# Patient Record
Sex: Female | Born: 1980 | Race: White | Hispanic: No | Marital: Single | Smoking: Current every day smoker
Health system: Southern US, Community
[De-identification: ages and names within clinical notes are randomized; demographics above are authoritative.]

## PROBLEM LIST (undated history)

## (undated) DIAGNOSIS — M419 Scoliosis, unspecified: Secondary | ICD-10-CM

## (undated) DIAGNOSIS — F419 Anxiety disorder, unspecified: Secondary | ICD-10-CM

---

## 2008-10-23 ENCOUNTER — Ambulatory Visit: Payer: Self-pay | Admitting: Family Medicine

## 2010-10-14 ENCOUNTER — Emergency Department: Payer: Self-pay | Admitting: Emergency Medicine

## 2011-01-22 ENCOUNTER — Emergency Department: Payer: Self-pay | Admitting: Emergency Medicine

## 2011-12-22 DIAGNOSIS — O14 Pre-eclampsia: Secondary | ICD-10-CM

## 2012-02-16 ENCOUNTER — Ambulatory Visit: Payer: Self-pay | Admitting: Internal Medicine

## 2012-05-28 ENCOUNTER — Emergency Department: Payer: Self-pay | Admitting: Emergency Medicine

## 2012-05-28 LAB — COMPREHENSIVE METABOLIC PANEL
Albumin: 4.2 g/dL (ref 3.4–5.0)
Alkaline Phosphatase: 61 U/L (ref 50–136)
Anion Gap: 7 (ref 7–16)
BUN: 12 mg/dL (ref 7–18)
Calcium, Total: 8.8 mg/dL (ref 8.5–10.1)
Co2: 27 mmol/L (ref 21–32)
Creatinine: 0.83 mg/dL (ref 0.60–1.30)
EGFR (Non-African Amer.): >60
Potassium: 3.9 mmol/L (ref 3.5–5.1)
SGOT(AST): 27 U/L (ref 15–37)
SGPT (ALT): 26 U/L (ref 12–78)
Total Protein: 8 g/dL (ref 6.4–8.2)

## 2012-05-28 LAB — CK TOTAL AND CKMB (NOT AT ARMC)
CK, Total: 101 U/L (ref 21–215)
CK-MB: <0.5 ng/mL — ABNORMAL LOW (ref 0.5–3.6)

## 2012-05-28 LAB — TROPONIN I: Troponin-I: <0.02 ng/mL

## 2012-05-28 LAB — PREGNANCY, URINE: Pregnancy Test, Urine: NEGATIVE m[IU]/mL

## 2012-05-28 LAB — CBC
MCH: 31.1 pg (ref 26.0–34.0)
MCHC: 34.7 g/dL (ref 32.0–36.0)
Platelet: 330 10*3/uL (ref 150–440)
RDW: 13 % (ref 11.5–14.5)

## 2012-05-28 LAB — URINALYSIS, COMPLETE
Bilirubin,UR: NEGATIVE
Ph: 7 (ref 4.5–8.0)
Protein: NEGATIVE
RBC,UR: 1 /HPF (ref 0–5)
Squamous Epithelial: 11
WBC UR: NONE SEEN /HPF (ref 0–5)

## 2012-09-07 ENCOUNTER — Emergency Department: Payer: Self-pay | Admitting: Unknown Physician Specialty

## 2013-07-06 ENCOUNTER — Inpatient Hospital Stay: Payer: Self-pay

## 2013-07-06 LAB — CBC WITH DIFFERENTIAL/PLATELET
Eosinophil #: 0.1 10*3/uL (ref 0.0–0.7)
Eosinophil %: 0.6 %
HCT: 34.8 % — ABNORMAL LOW (ref 35.0–47.0)
Lymphocyte %: 14.5 %
MCH: 30.3 pg (ref 26.0–34.0)
MCHC: 35.3 g/dL (ref 32.0–36.0)
MCV: 86 fL (ref 80–100)
Monocyte %: 7.6 %
Neutrophil #: 10.8 10*3/uL — ABNORMAL HIGH (ref 1.4–6.5)
Platelet: 337 10*3/uL (ref 150–440)
RBC: 4.05 10*6/uL (ref 3.80–5.20)
RDW: 13.6 % (ref 11.5–14.5)
WBC: 14.1 10*3/uL — ABNORMAL HIGH (ref 3.6–11.0)

## 2014-05-10 ENCOUNTER — Emergency Department: Payer: Self-pay | Admitting: Emergency Medicine

## 2014-07-26 ENCOUNTER — Emergency Department: Payer: Self-pay | Admitting: Emergency Medicine

## 2015-02-04 ENCOUNTER — Emergency Department
Admit: 2015-02-04 | Disposition: A | Discharge status: Home / Self Care | Payer: Self-pay | Admitting: Emergency Medicine

## 2015-02-04 LAB — COMPREHENSIVE METABOLIC PANEL
ALK PHOS: 58 U/L
ALT: 16 U/L
ANION GAP: 10 (ref 7–16)
AST: 27 U/L
Albumin: 4.5 g/dL
BILIRUBIN TOTAL: 0.3 mg/dL
BUN: 8 mg/dL
CHLORIDE: 100 mmol/L — AB
Calcium, Total: 9.3 mg/dL
Co2: 26 mmol/L
Creatinine: 0.71 mg/dL
EGFR (African American): >60
Glucose: 113 mg/dL — ABNORMAL HIGH
Potassium: 3.5 mmol/L
Sodium: 136 mmol/L
TOTAL PROTEIN: 7.8 g/dL

## 2015-02-04 LAB — CBC WITH DIFFERENTIAL/PLATELET
Basophil #: 0 10*3/uL (ref 0.0–0.1)
Basophil %: 0.6 %
Eosinophil #: 0 10*3/uL (ref 0.0–0.7)
Eosinophil %: 0.7 %
HCT: 39.5 % (ref 35.0–47.0)
HGB: 13.7 g/dL (ref 12.0–16.0)
LYMPHS PCT: 24 %
Lymphocyte #: 0.9 10*3/uL — ABNORMAL LOW (ref 1.0–3.6)
MCH: 31.2 pg (ref 26.0–34.0)
MCHC: 34.6 g/dL (ref 32.0–36.0)
MCV: 90 fL (ref 80–100)
MONOS PCT: 13 %
Monocyte #: 0.5 x10 3/mm (ref 0.2–0.9)
NEUTROS ABS: 2.2 10*3/uL (ref 1.4–6.5)
Neutrophil %: 61.7 %
Platelet: 309 10*3/uL (ref 150–440)
RBC: 4.38 10*6/uL (ref 3.80–5.20)
RDW: 12.1 % (ref 11.5–14.5)
WBC: 3.6 10*3/uL (ref 3.6–11.0)

## 2015-02-04 LAB — URINALYSIS, COMPLETE
Bilirubin,UR: NEGATIVE
Glucose,UR: NEGATIVE mg/dL (ref 0–75)
KETONE: NEGATIVE
Leukocyte Esterase: NEGATIVE
Nitrite: NEGATIVE
PH: 7 (ref 4.5–8.0)
PROTEIN: NEGATIVE
SPECIFIC GRAVITY: 1.009 (ref 1.003–1.030)

## 2015-02-16 NOTE — H&P (Signed)
L&D Evaluation:  History:  HPI Pt is a 34 yo G4P1021 that is 39.[redacted] weeks GA who presents to L&D with painful ctx that started last night. She has a hx of a previous c-section for failure to descend. [facility] and has [facility] signed to [facility] so. She reports +FM, denies vb or LOF. She is O+, VI, RNI and GBS-. She is 9/100 with a bulging bag upon admission.   Presents with contractions   Patient's Medical History other  anemia   Patient's Surgical History Previous C-Section   Medications Pre Natal Vitamins  Iron   Allergies NKDA   Social History none   Family History Non-Contributory   ROS:  ROS All systems were reviewed.  HEENT, CNS, GI, GU, Respiratory, CV, Renal and Musculoskeletal systems were found to be normal.   Exam:  Vital Signs stable   General no apparent distress   Mental Status clear   Chest clear   Heart normal sinus rhythm   Abdomen gravid, tender with contractions   Back no CVAT   Mebranes Intact   FHT normal rate with no decels   Fetal Heart Rate 130   Ucx regular, every 2-4 min   Skin dry, no lesions   Lymph no lymphadenopathy   Impression:  Impression active labor, reactive NST, IUP at 39.5, desires VBAC   Plan:  Plan admit under VBAC protocol.   Follow Up Appointment need to schedule   Electronic Signatures: Jannet MantisSubudhi, Maikel Neisler (CNM)  (Signed 28-Sep-14 10:33)  Authored: L&D Evaluation   Last Updated: 28-Sep-14 10:33 by Jannet MantisSubudhi, Rashaad Hallstrom (CNM)

## 2015-07-19 ENCOUNTER — Encounter: Payer: Self-pay | Admitting: Emergency Medicine

## 2015-07-19 ENCOUNTER — Emergency Department
Admission: EM | Admit: 2015-07-19 | Discharge: 2015-07-19 | Disposition: A | Discharge status: Home / Self Care | Payer: Medicaid Other | Attending: Emergency Medicine | Admitting: Emergency Medicine

## 2015-07-19 DIAGNOSIS — M545 Low back pain, unspecified: Secondary | ICD-10-CM

## 2015-07-19 DIAGNOSIS — Z3202 Encounter for pregnancy test, result negative: Secondary | ICD-10-CM | POA: Diagnosis not present

## 2015-07-19 DIAGNOSIS — Z720 Tobacco use: Secondary | ICD-10-CM | POA: Insufficient documentation

## 2015-07-19 HISTORY — DX: Scoliosis, unspecified: M41.9

## 2015-07-19 LAB — URINALYSIS COMPLETE WITH MICROSCOPIC (ARMC ONLY)
BILIRUBIN URINE: NEGATIVE
GLUCOSE, UA: NEGATIVE mg/dL
HGB URINE DIPSTICK: NEGATIVE
Ketones, ur: NEGATIVE mg/dL
Leukocytes, UA: NEGATIVE
NITRITE: NEGATIVE
Protein, ur: NEGATIVE mg/dL
SPECIFIC GRAVITY, URINE: 1.01 (ref 1.005–1.030)
pH: 8 (ref 5.0–8.0)

## 2015-07-19 LAB — COMPREHENSIVE METABOLIC PANEL
ALBUMIN: 4.6 g/dL (ref 3.5–5.0)
ALT: 15 U/L (ref 14–54)
AST: 22 U/L (ref 15–41)
Alkaline Phosphatase: 50 U/L (ref 38–126)
Anion gap: 7 (ref 5–15)
BILIRUBIN TOTAL: 0.3 mg/dL (ref 0.3–1.2)
BUN: 15 mg/dL (ref 6–20)
CALCIUM: 9.1 mg/dL (ref 8.9–10.3)
CO2: 27 mmol/L (ref 22–32)
Chloride: 101 mmol/L (ref 101–111)
Creatinine, Ser: 0.61 mg/dL (ref 0.44–1.00)
GFR calc Af Amer: >60 mL/min (ref >60)
GFR calc non Af Amer: >60 mL/min (ref >60)
GLUCOSE: 91 mg/dL (ref 65–99)
Potassium: 3.8 mmol/L (ref 3.5–5.1)
Sodium: 135 mmol/L (ref 135–145)
Total Protein: 7.6 g/dL (ref 6.5–8.1)

## 2015-07-19 LAB — CBC
HCT: 36.1 % (ref 35.0–47.0)
Hemoglobin: 12.4 g/dL (ref 12.0–16.0)
MCH: 31 pg (ref 26.0–34.0)
MCHC: 34.4 g/dL (ref 32.0–36.0)
MCV: 90.2 fL (ref 80.0–100.0)
Platelets: 300 10*3/uL (ref 150–440)
RBC: 4 MIL/uL (ref 3.80–5.20)
RDW: 12.3 % (ref 11.5–14.5)
WBC: 4.7 10*3/uL (ref 3.6–11.0)

## 2015-07-19 LAB — POCT PREGNANCY, URINE: Preg Test, Ur: NEGATIVE

## 2015-07-19 LAB — LIPASE, BLOOD: Lipase: 26 U/L (ref 22–51)

## 2015-07-19 MED ORDER — TRAMADOL HCL 50 MG PO TABS: 50.0000 mg | ORAL_TABLET | Freq: Four times a day (QID) | ORAL | Status: AC | PRN

## 2015-07-19 MED ORDER — TRAMADOL HCL 50 MG PO TABS
ORAL_TABLET | ORAL | Status: AC
  Administered 2015-07-19: 50 mg via ORAL
  Filled 2015-07-19: qty 1

## 2015-07-19 MED ORDER — TRAMADOL HCL 50 MG PO TABS
50.0000 mg | ORAL_TABLET | Freq: Once | ORAL | Status: AC
  Administered 2015-07-19: 50 mg via ORAL

## 2015-07-19 NOTE — ED Notes (Signed)
States has had flank pain x several months, thought it was due to scoliosis but became concerned she might have a kidney infection, denies fevers, urine has unusual odor

## 2015-07-19 NOTE — ED Provider Notes (Signed)
[facility] Emergency Department Provider Note  Time seen: 8:14 PM  I have reviewed the triage vital signs and the nursing notes.   HISTORY  Chief Complaint Flank Pain    HPI Cheyenne Weeks is a 34 y.o. female with a past medical history of scoliosis presents to the emergency department with lower back pain. According to the patient she has had lower back pain intermittently for the past several months, but has been worse over the past several days and the patient was concerned that she might be having a urinary/kidney infection. She states several months ago she had similar pain and ended up having a kidney infection requiring antibiotics. Patient states a history of scoliosis and often will get back pain, so she is not sure if it is more muscular pain or if it is a kidney infection. Denies any dysuria, has stated somewhat darker appearing urine, denies any fever, nausea, vomiting, diarrhea, vaginal discharge or bleeding. Describes her discomfort as moderate.     Past Medical History  Diagnosis Date  . Scoliosis     There are no active problems to display for this patient.   Past Surgical History  Procedure Laterality Date  . Cesarean section      No current outpatient prescriptions on file.  Allergies Review of patient's allergies indicates no known allergies.  No family history on file.  Social History Social History  Substance Use Topics  . Smoking status: Current Every Day Smoker -- 0.10 packs/day    Types: Cigarettes  . Smokeless tobacco: None  . Alcohol Use: Yes    Review of Systems Constitutional: Negative for fever. Cardiovascular: Negative for chest pain. Respiratory: Negative for shortness of breath. Gastrointestinal: Negative for abdominal pain Genitourinary: Negative for dysuria. Darker urine. Musculoskeletal: Moderate lower back pain bilaterally 10-point ROS otherwise  negative.  ____________________________________________   PHYSICAL EXAM:  VITAL SIGNS: ED Triage Vitals  Enc Vitals Group     BP 07/19/15 1840 127/96 mmHg     Pulse Rate 07/19/15 1840 85     Resp 07/19/15 1840 18     Temp 07/19/15 1840 98.7 F (37.1 C)     Temp Source 07/19/15 1840 Oral     SpO2 07/19/15 1840 100 %     Weight 07/19/15 1840 138 lb (62.596 kg)     Height 07/19/15 1840  (1.651 m)     Head Cir --      Peak Flow --      Pain Score 07/19/15 1841 6     Pain Loc --      Pain Edu? --      Excl. in GC? --     Constitutional: Alert and oriented. Well appearing and in no distress. Eyes: Normal exam ENT   Head: Normocephalic and atraumatic.   Mouth/Throat: Mucous membranes are moist. Cardiovascular: Normal rate, regular rhythm. No murmur Respiratory: Normal respiratory effort without tachypnea nor retractions. Breath sounds are clear and equal bilaterally. No wheezes/rales/rhonchi. Gastrointestinal: Soft and nontender. No distention.  There is no CVA tenderness. Mild lower/lumbar spinal tenderness palpation. Musculoskeletal: Nontender with normal range of motion in all extremities.  Neurologic:  Normal speech and language. No gross focal neurologic deficits  Psychiatric: Mood and affect are normal. Speech and behavior are normal. Patient exhibits appropriate insight and judgment.  ____________________________________________    INITIAL IMPRESSION / ASSESSMENT AND PLAN / ED COURSE  Pertinent labs & imaging results that were available during my care of the patient were  reviewed by me and considered in my medical decision making (see chart for details).  Labs are within normal limits. Urinalysis does not show any signs of urinary tract infection. I discussed results with the patient, I will add on a urine culture and prescribed Ultram for likely musculoskeletal back pain. Patient is agreeable to plan and will follow up with her primary care physician if pain  continues.  ____________________________________________   FINAL CLINICAL IMPRESSION(S) / ED DIAGNOSES  Back pain   Minna Antis, MD 07/19/15 2017

## 2015-07-19 NOTE — Discharge Instructions (Signed)

## 2015-07-21 LAB — URINE CULTURE: Culture: 7000

## 2016-01-24 ENCOUNTER — Encounter: Payer: Self-pay | Admitting: Emergency Medicine

## 2016-01-24 ENCOUNTER — Emergency Department
Admission: EM | Admit: 2016-01-24 | Discharge: 2016-01-24 | Disposition: A | Discharge status: Home / Self Care | Payer: Medicaid Other | Attending: Emergency Medicine | Admitting: Emergency Medicine

## 2016-01-24 DIAGNOSIS — F17210 Nicotine dependence, cigarettes, uncomplicated: Secondary | ICD-10-CM | POA: Diagnosis not present

## 2016-01-24 DIAGNOSIS — S0922XA Traumatic rupture of left ear drum, initial encounter: Secondary | ICD-10-CM | POA: Diagnosis not present

## 2016-01-24 DIAGNOSIS — H9202 Otalgia, left ear: Secondary | ICD-10-CM

## 2016-01-24 DIAGNOSIS — Y999 Unspecified external cause status: Secondary | ICD-10-CM | POA: Diagnosis not present

## 2016-01-24 DIAGNOSIS — Y929 Unspecified place or not applicable: Secondary | ICD-10-CM | POA: Insufficient documentation

## 2016-01-24 DIAGNOSIS — Y939 Activity, unspecified: Secondary | ICD-10-CM | POA: Insufficient documentation
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
## 2016-01-24 NOTE — Discharge Instructions (Signed)
Tympanoplasty Your eardrum (tympanic membrane) is a thin layer of tissue that separates the outer part of your ear from your middle ear. Sometimes a hole can develop in your tympanic membrane. It can tear or become perforated. A hole in your tympanic membrane can allow water and germs to enter your middle ear and can cause infection and hearing loss.  A small hole in your tympanic membrane that is not infected often heals by itself. A hole that does not heal on its own may require surgery (tympanoplasty). This surgery repairs the hole with a piece of tissue from another part of your body (graft). Your caregiver usually will wait at least 6 weeks after the hole develops to see if it heals by itself.  The most common causes of a hole developing in the tympanic membrane include:  Infection.  Injury.  Puncture injury. Perforation caused by an object inserted too far into the ear, such as a cotton-tipped swab.  Air pressure injury. Perforation caused by a sudden change in air pressure, such as during scuba diving. LET YOUR CAREGIVER KNOW ABOUT:  History of bleeding disorders.  Recent colds or infections you have had, particularly any drainage or moisture in your injured ear.  Allergies you have to medicine, including numbing medicine (local anesthetics) and medicine to make you go to sleep (general anesthetic).  All medicines you are taking, including herbs, over-the-counter medicines, and creams.  Previous surgeries you have had.  History of smoking.  Possibility of pregnancy, if this applies.  History of blood clots.  Other health problems. RISKS AND COMPLICATIONS Complications associated with tympanoplasty are rare, but they can occur. Possible complications include:  Dizziness or a feeling that things are spinning around you (vertigo).  Bleeding or infection in the ear.  Damage to the small bones in your ear, which can cause hearing loss.  Nerve damage. A small nerve that  transmits taste from the sides of your tongue goes through the middle part of your ear. If this nerve is damaged during surgery, you may have a metal taste in your mouth. It usually goes away in time.  Graft failure. This means that the graft does not attach to the tympanic membrane. BEFORE THE PROCEDURE Your caregiver will:  Examine your ear before surgery to ensure that your tympanic membrane is dry.  Test your hearing(audiometry).  Do an X-ray if there is any signs of infection. You will need to stop eating and drinking 8 hours before the surgery. Also, you may need to stop taking certain medicines before the surgery. Ask your caregiver for specific instructions. Just before your procedure, you will speak with an anesthesiologist. This is the person who will administer the anesthetic. Your anesthesiologist will talk to you about the type of anesthetic that will be used. Usually, tympanoplasty is performed with the use of a general anesthetic.  Small monitors will be placed on your body to check your heart rate, blood pressure, and oxygen level. An intravenous (IV) tube will be inserted in your arm or hand. Medicine will flow directly into your body through the IV tube. PROCEDURE To repair a hole in your tympanic membrane, a graftis used. The graft will act as a patch. Most of the time, the graft comes from your scalp. To put the graft in place, a cut (incision) is made in your ear canal, and the graft is slipped under the hole in the tympanic membrane. Dissolvable sponges are placed in your middle ear and in your ear canal  to hold the graft in place. The surgery usually takes 1 to 2 hours. Antibiotic drops may be used in your ear to prevent infection. A cotton ball is placed over the opening of your ear. AFTER THE PROCEDURE  You will be taken to a recovery area until the anesthesia has worn off. Your blood pressure and pulse will be checked regularly. The IV tube will be taken out.  Some  pain is normal after tympanoplasty. You may be given pain medicine while you are in the recovery area.  Most people can go home a few hours after surgery.   This information is not intended to replace advice given to you by your health care provider. Make sure you discuss any questions you have with your health care provider.   Document Released: 03/03/2011 Document Revised: 01/20/2013 Document Reviewed: 03/03/2011 Elsevier Interactive Patient Education 2016 [employer].   You have a small defect (hole) in your ear drum. It will likely heal without intervention. Avoid getting any water or fluids into your ear. Follow-up with ENT in a week or 2 as directed.
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
## 2016-01-24 NOTE — ED Notes (Signed)
Pt presents with left ear pain since Saturday.

## 2016-03-28 ENCOUNTER — Encounter: Payer: Self-pay | Admitting: Emergency Medicine

## 2016-03-28 ENCOUNTER — Emergency Department
Admission: EM | Admit: 2016-03-28 | Discharge: 2016-03-28 | Disposition: A | Discharge status: Home / Self Care | Payer: Medicaid Other | Attending: Emergency Medicine | Admitting: Emergency Medicine

## 2016-03-28 DIAGNOSIS — F410 Panic disorder [episodic paroxysmal anxiety] without agoraphobia: Secondary | ICD-10-CM | POA: Diagnosis present

## 2016-03-28 DIAGNOSIS — F17210 Nicotine dependence, cigarettes, uncomplicated: Secondary | ICD-10-CM | POA: Insufficient documentation

## 2016-03-28 MED ORDER — LORAZEPAM 0.5 MG PO TABS: 0.5000 mg | ORAL_TABLET | Freq: Two times a day (BID) | ORAL | Status: AC | PRN

## 2016-03-28 MED ORDER — LORAZEPAM 0.5 MG PO TABS
0.5000 mg | ORAL_TABLET | Freq: Once | ORAL | Status: AC
  Administered 2016-03-28: 0.5 mg via ORAL
  Filled 2016-03-28: qty 1

## 2016-03-28 NOTE — ED Notes (Signed)
[facility] for anxiety/panic attack.  Patient states, "I feel like my head is swelling up and my fingers are numb."  Patient reports that she is under an extreme amount of stress for the past several years.  Patient is calm but tearful at this time.  Patient denies SI and HI.  Patient states she has history of panic attacks and this feels the same.  Respirations are even and nonlabored at this time.
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
## 2016-03-28 NOTE — Discharge Instructions (Signed)

## 2016-04-12 ENCOUNTER — Encounter: Payer: Self-pay | Admitting: *Deleted

## 2016-04-12 ENCOUNTER — Emergency Department: Payer: Medicaid Other

## 2016-04-12 ENCOUNTER — Emergency Department
Admission: EM | Admit: 2016-04-12 | Discharge: 2016-04-12 | Disposition: A | Discharge status: Home / Self Care | Payer: Medicaid Other | Attending: Emergency Medicine | Admitting: Emergency Medicine

## 2016-04-12 DIAGNOSIS — F17210 Nicotine dependence, cigarettes, uncomplicated: Secondary | ICD-10-CM | POA: Insufficient documentation

## 2016-04-12 DIAGNOSIS — K920 Hematemesis: Secondary | ICD-10-CM | POA: Insufficient documentation

## 2016-04-12 HISTORY — DX: Anxiety disorder, unspecified: F41.9

## 2016-04-12 LAB — CBC WITH DIFFERENTIAL/PLATELET
BASOS ABS: 0 10*3/uL (ref 0–0.1)
Basophils Relative: 1 %
Eosinophils Absolute: 0 10*3/uL (ref 0–0.7)
Eosinophils Relative: 0 %
HEMATOCRIT: 39.2 % (ref 35.0–47.0)
Hemoglobin: 13.6 g/dL (ref 12.0–16.0)
LYMPHS PCT: 26 %
Lymphs Abs: 1.2 10*3/uL (ref 1.0–3.6)
MCH: 32 pg (ref 26.0–34.0)
MCHC: 34.7 g/dL (ref 32.0–36.0)
MCV: 92.2 fL (ref 80.0–100.0)
Monocytes Absolute: 0.6 10*3/uL (ref 0.2–0.9)
Monocytes Relative: 12 %
NEUTROS ABS: 2.8 10*3/uL (ref 1.4–6.5)
NEUTROS PCT: 61 %
Platelets: 250 10*3/uL (ref 150–440)
RBC: 4.25 MIL/uL (ref 3.80–5.20)
RDW: 12.5 % (ref 11.5–14.5)
WBC: 4.6 10*3/uL (ref 3.6–11.0)

## 2016-04-12 LAB — COMPREHENSIVE METABOLIC PANEL
ALBUMIN: 5 g/dL (ref 3.5–5.0)
ALT: 43 U/L (ref 14–54)
ANION GAP: 9 (ref 5–15)
AST: 71 U/L — AB (ref 15–41)
Alkaline Phosphatase: 63 U/L (ref 38–126)
BILIRUBIN TOTAL: 1 mg/dL (ref 0.3–1.2)
BUN: 8 mg/dL (ref 6–20)
CHLORIDE: 102 mmol/L (ref 101–111)
CO2: 25 mmol/L (ref 22–32)
Calcium: 10 mg/dL (ref 8.9–10.3)
Creatinine, Ser: 0.74 mg/dL (ref 0.44–1.00)
GFR calc Af Amer: >60 mL/min (ref >60)
GFR calc non Af Amer: >60 mL/min (ref >60)
GLUCOSE: 99 mg/dL (ref 65–99)
Potassium: 3.4 mmol/L — ABNORMAL LOW (ref 3.5–5.1)
Sodium: 136 mmol/L (ref 135–145)
TOTAL PROTEIN: 8.1 g/dL (ref 6.5–8.1)

## 2016-04-12 LAB — LIPASE, BLOOD: LIPASE: 17 U/L (ref 11–51)

## 2016-04-12 MED ORDER — PANTOPRAZOLE SODIUM 40 MG PO TBEC
40.0000 mg | DELAYED_RELEASE_TABLET | Freq: Once | ORAL | Status: AC
  Administered 2016-04-12: 40 mg via ORAL
  Filled 2016-04-12: qty 1

## 2016-04-12 NOTE — ED Notes (Signed)
Pt able to ambulate independently and without distress. Pt verbalized understanding of follow up since pt is leaving against Dr. Cyndie MullElliot's advice.

## 2016-04-12 NOTE — ED Notes (Signed)
Pt arrived to ED from home reporting she "coughed" up blood  This morning but after describing the event pt reports it was more like gagging and then mucus and blood emesis. Pt reports a hx of gastric ulcers that she is worried are irritated. PT reports having had increased stress and panic attack. Pt reports she has had rash on forehead and right eye for the past week and was dx with Shingles today. Pt reports having received Valtrex medication to treat Shingles. Pt is alert and oriented at this time. Pt in NAD.

## 2016-04-12 NOTE — Discharge Instructions (Signed)
Hematemesis Hematemesis is when you vomit blood. It is a sign of bleeding in the upper part of your digestive tract. This is also called your gastrointestinal (GI) tract. Your upper GI tract includes your mouth, throat, esophagus, stomach, and the first part of your small intestine (duodenum).  Hematemesis is usually caused by bleeding from your esophagus or stomach. You may suddenly vomit bright red blood. You might also vomit old blood. It may look like coffee grounds. You may also have other symptoms, such as:  Stomach pain.  Heartburn.  Black and tarry stool.  HOME CARE INSTRUCTIONS  Watch your hematemesis for any changes. The following actions may help to lessen any discomfort you are feeling:  Take medicines only as directed by your health care provider. Do not take aspirin, ibuprofen, or any other anti-inflammatory medicine without approval from your health care provider.  Rest as needed.  Drink small sips of clear liquids often, as long as you can keep them down. Try to drink enough fluids to keep your urine clear or pale yellow.  Do not drink alcohol.  Do not use any tobacco products, including cigarettes, chewing tobacco, or electronic cigarettes. If you need help quitting, ask your health care provider.  Keep all follow-up visits as directed by your health care provider. This is important. SEEK MEDICAL CARE IF:   The vomiting of blood worsens, or begins again after it has stopped.  You have persistent stomach pain.  You have nausea, indigestion, or heartburn.  You feel weak or dizzy. SEEK IMMEDIATE MEDICAL CARE IF:   You faint or feel extremely weak.  You have a rapid heartbeat.  You are urinating less than normal or not at all.  You have persistent vomiting.  You vomit large amounts of bloody or dark material.  You vomit bright red blood.  You pass large, dark, or bloody stools.  You have chest pain or trouble breathing.   This information is not  intended to replace advice given to you by your health care provider. Make sure you discuss any questions you have with your health care provider.   Document Released: 11/02/2004 Document Revised: 10/16/2014 Document Reviewed: 05/20/2014 Elsevier Interactive Patient Education [employer]2016 [employer].   Please use the Zofran 1 pill 4 times a day for the next 2 days. Take the proton pump inhibitor the protonic's one a day. Mechele Collinlliott should call you tomorrow to schedule follow-up. If he does not please call him by afternoon tomorrow. Please be sure to return for any further symptoms as noted above especially if she vomited up more blood become weak or lightheaded or feel sicker. Dr. Mechele CollinElliott wants you to eat a liquid diet for the next day or 2. This will help seizure to evaluate you.

## 2016-04-12 NOTE — ED Provider Notes (Signed)
[facility] Emergency Department Provider Note   ____________________________________________  Time seen: Approximately 9:50 PM  I have reviewed the triage vital signs and the nursing notes.   HISTORY  Chief Complaint Hematemesis   HPI Cheyenne Weeks is a 35 y.o. female patient recently started valacyclovir for shingles. She went home today from visiting her doctor and had some gagging and vomited 3 times. She said blood came up each time. She showed me a picture of one of the episodes of vomitus which showed a fine speckled pattern of blood on the floor. There were no big clumps of blood and large volume. Patient reports she sometimes has mid abdominal pain is not having any pain at present. Patient is not lightheaded patient otherwise felt well. She wonders if she may have an ulcer. Patient H&H is actually higher than it was a few months ago. She looks good. She has no further vomiting in the ER. After recent having all the blood work returned I discussed the patient with Dr. Mechele CollinElliott gastroenterology. Dr. Mechele CollinElliott would like to observe her overnight with serial crits and IV fluids and a upper GI in the morning. I discussed with this this with the patient patient really wants to go home. Patient wants to tell me that it's okay to go home. I told her that Dr. Mechele CollinElliott is an expert in a very good and care. Dr. Teola Bradleyrost's judgment I also told her that Dr. Weston AnnaEllington said if she insisted on going home and we can give her proton pump inhibitor Zofran and follow-up with him. She understands that best course of action would be to stay in the hospital and that the safest course of action would be to stay in the hospital but she is worried about her children and her mother. So she insists on going home. She understands again that this is not what we recommend.   Past Medical History  Diagnosis Date  . Scoliosis   . Anxiety     There are no active problems to display for this  patient.   Past Surgical History  Procedure Laterality Date  . Cesarean section      Current Outpatient Rx  Name  Route  Sig  Dispense  Refill  . LORazepam (ATIVAN) 0.5 MG tablet   Oral   Take 1 tablet (0.5 mg total) by mouth 2 (two) times daily as needed for anxiety.   14 tablet   0   . traMADol (ULTRAM) 50 MG tablet   Oral   Take 1 tablet (50 mg total) by mouth every 6 (six) hours as needed.   20 tablet   0     Allergies Review of patient's allergies indicates no known allergies.  History reviewed. No pertinent family history.  Social History Social History  Substance Use Topics  . Smoking status: Current Every Day Smoker -- 0.50 packs/day    Types: Cigarettes  . Smokeless tobacco: None  . Alcohol Use: Yes     Comment: occasionally    Review of Systems Constitutional: No fever/chills Eyes: No visual changes. ENT: No sore throat. Cardiovascular: Denies chest pain. Respiratory: Denies shortness of breath. Gastrointestinal: No abdominal pain At present.  No diarrhea.  No constipation. Genitourinary: Negative for dysuria. Musculoskeletal: Negative for back pain. Skin: Shingles Neurological: Negative for headaches, focal weakness or numbness.  10-point ROS otherwise negative.  ____________________________________________   PHYSICAL EXAM:  VITAL SIGNS: ED Triage Vitals  Enc Vitals Group     BP 04/12/16  1745 110/85 mmHg     Pulse Rate 04/12/16 1745 77     Resp 04/12/16 1745 16     Temp 04/12/16 1745 97.8 F (36.6 C)     Temp Source 04/12/16 1745 Oral     SpO2 04/12/16 1745 100 %     Weight 04/12/16 1745 140 lb (63.504 kg)     Height 04/12/16 1745 5\' 6"  (1.676 m)     Head Cir --      Peak Flow --      Pain Score --      Pain Loc --      Pain Edu? --      Excl. in [place]? --     Constitutional: Alert and oriented. Well appearing and in no acute distress. Eyes: Conjunctivae are normal. PERRL. EOMI. Head: Atraumatic. Nose: No  congestion/rhinnorhea. Mouth/Throat: Mucous membranes are moist.  Oropharynx non-erythematous. Neck: No stridor.  Cardiovascular: Normal rate, regular rhythm. Grossly normal heart sounds.  Good peripheral circulation. Respiratory: Normal respiratory effort.  No retractions. Lungs CTAB. Gastrointestinal: Soft and nontender. No distention. No abdominal bruits. No CVA tenderness. Musculoskeletal: No lower extremity tenderness nor edema.  No joint effusions. Neurologic:  Normal speech and language. No gross focal neurologic deficits are appreciated. No gait instability. Skin:  Skin is warm, dry and intact. No rash noted. Psychiatric: Mood and affect are normal. Speech and behavior are normal.  ____________________________________________   LABS (all labs ordered are listed, but only abnormal results are displayed)  Labs Reviewed  COMPREHENSIVE METABOLIC PANEL - Abnormal; Notable for the following:    Potassium 3.4 (*)    AST 71 (*)    All other components within normal limits  LIPASE, BLOOD  CBC WITH DIFFERENTIAL/PLATELET  PREGNANCY, URINE  URINALYSIS COMPLETEWITH MICROSCOPIC ([facility] ONLY)  URINE DRUG SCREEN, QUALITATIVE ([facility] ONLY)   ____________________________________________  EKG   ____________________________________________  RADIOLOGY  Chest x-ray shows no acute disease is was done as the patient says she also coughed up some clear phlegm. ____________________________________________   PROCEDURES    Procedures    ____________________________________________   INITIAL IMPRESSION / ASSESSMENT AND PLAN / ED COURSE  Pertinent labs & imaging results that were available during my care of the patient were reviewed by me and considered in my medical decision making (see chart for details).   ____________________________________________   FINAL CLINICAL IMPRESSION(S) / ED DIAGNOSES  Final diagnoses:  Hematemesis, presence of nausea not specified      NEW  MEDICATIONS STARTED DURING THIS VISIT:  New Prescriptions   No medications on file     Note:  This document was prepared using Dragon voice recognition software and may include unintentional dictation errors.    Arnaldo NatalPaul F Malinda, MD 04/12/16 2156

## 2016-07-18 ENCOUNTER — Other Ambulatory Visit: Payer: Self-pay | Admitting: Nurse Practitioner

## 2016-07-18 DIAGNOSIS — N63 Unspecified lump in unspecified breast: Secondary | ICD-10-CM

## 2016-07-26 ENCOUNTER — Ambulatory Visit
Admission: RE | Admit: 2016-07-26 | Discharge: 2016-07-26 | Disposition: A | Discharge status: Home / Self Care | Payer: Medicaid Other | Source: Ambulatory Visit | Attending: Nurse Practitioner | Admitting: Nurse Practitioner

## 2016-07-26 DIAGNOSIS — N63 Unspecified lump in unspecified breast: Secondary | ICD-10-CM

## 2016-07-26 DIAGNOSIS — N6324 Unspecified lump in the left breast, lower inner quadrant: Secondary | ICD-10-CM | POA: Diagnosis not present

## 2018-01-11 ENCOUNTER — Telehealth: Payer: Self-pay | Admitting: Obstetrics & Gynecology

## 2018-01-11 NOTE — Telephone Encounter (Signed)
[facility] referring for pregnancy. Called and left voicemail for patient to call back to be schedule

## 2018-01-16 NOTE — Telephone Encounter (Signed)
Called and left voice mail for patient to call back to be schedule °

## 2018-01-17 NOTE — Telephone Encounter (Signed)
Unable to leave voicemail due to voicemail box is full. °

## 2019-08-03 ENCOUNTER — Other Ambulatory Visit: Payer: Self-pay

## 2019-08-03 ENCOUNTER — Emergency Department
Admission: EM | Admit: 2019-08-03 | Discharge: 2019-08-03 | Disposition: A | Discharge status: Home / Self Care | Payer: Medicaid Other | Attending: Emergency Medicine | Admitting: Emergency Medicine

## 2019-08-03 ENCOUNTER — Emergency Department: Payer: Medicaid Other

## 2019-08-03 DIAGNOSIS — M25532 Pain in left wrist: Secondary | ICD-10-CM | POA: Diagnosis present

## 2019-08-03 DIAGNOSIS — Y999 Unspecified external cause status: Secondary | ICD-10-CM | POA: Insufficient documentation

## 2019-08-03 DIAGNOSIS — W08XXXA Fall from other furniture, initial encounter: Secondary | ICD-10-CM | POA: Insufficient documentation

## 2019-08-03 DIAGNOSIS — F17210 Nicotine dependence, cigarettes, uncomplicated: Secondary | ICD-10-CM | POA: Insufficient documentation

## 2019-08-03 DIAGNOSIS — Y9389 Activity, other specified: Secondary | ICD-10-CM | POA: Diagnosis not present

## 2019-08-03 DIAGNOSIS — Y92018 Other place in single-family (private) house as the place of occurrence of the external cause: Secondary | ICD-10-CM | POA: Insufficient documentation

## 2019-08-03 DIAGNOSIS — S63502A Unspecified sprain of left wrist, initial encounter: Secondary | ICD-10-CM

## 2019-08-03 NOTE — ED Notes (Signed)
Patient transported to X-ray 

## 2019-08-03 NOTE — ED Provider Notes (Signed)
[facility] Emergency Department Provider Note  Time seen: 6:44 AM  I have reviewed the triage vital signs and the nursing notes.   HISTORY  Chief Complaint Wrist Pain   HPI Cheyenne Weeks is a 38 y.o. female with a past medical history anxiety presents to the emergency department for left wrist pain.  According to the patient approximately 2 days ago she fell landing on her left wrist.  States since that time she has had pain in the left wrist, worse with certain movements of the left wrist.  She has also noticed some mild swelling to the area.  Patient came to the emergency department today for evaluation.  Patient denies any other injuries.  Denies any other pain.   Past Medical History:  Diagnosis Date  . Anxiety   . Scoliosis     There are no active problems to display for this patient.   Past Surgical History:  Procedure Laterality Date  . CESAREAN SECTION      Prior to Admission medications   Not on File    No Known Allergies  No family history on file.  Social History Social History   Tobacco Use  . Smoking status: Current Every Day Smoker    Packs/day: 0.50    Types: Cigarettes  Substance Use Topics  . Alcohol use: Yes    Comment: occasionally  . Drug use: Not on file    Review of Systems Constitutional: Negative for fever. Cardiovascular: Negative for chest pain. Respiratory: Negative for shortness of breath. Gastrointestinal: Negative for abdominal pain Musculoskeletal: Left wrist pain Neurological: Negative for headache All other ROS negative  ____________________________________________   PHYSICAL EXAM:  VITAL SIGNS: ED Triage Vitals  Enc Vitals Group     BP 08/03/19 0241 119/87     Pulse Rate 08/03/19 0241 (!) 109     Resp 08/03/19 0241 18     Temp 08/03/19 0241 98.4 F (36.9 C)     Temp Source 08/03/19 0241 Oral     SpO2 08/03/19 0241 94 %     Weight 08/03/19 0235 (!) 1369 lb (621 kg)     Height 08/03/19  0235 5\' 5"  (1.651 m)     Head Circumference --      Peak Flow --      Pain Score 08/03/19 0235 4     Pain Loc --      Pain Edu? --      Excl. in [place]? --     Constitutional: Alert and oriented. Well appearing and in no distress. Eyes: Normal exam ENT      Head: Normocephalic and atraumatic.      Mouth/Throat: Mucous membranes are moist. Cardiovascular: Normal rate, regular rhythm. Respiratory: Normal respiratory effort without tachypnea nor retractions. Breath sounds are clear  Gastrointestinal: Soft and nontender. No distention Musculoskeletal: Mild tenderness palpation of the left wrist.  Minimal swelling.  No tenderness over the snuffbox. Neurologic:  Normal speech and language. No gross focal neurologic deficits  Skin:  Skin is warm, dry and intact.  Psychiatric: Patient is anxious appearing.  ____________________________________________    RADIOLOGY  X-ray showed mild swelling but no other acute abnormality.  ____________________________________________   INITIAL IMPRESSION / ASSESSMENT AND PLAN / ED COURSE  Pertinent labs & imaging results that were available during my care of the patient were reviewed by me and considered in my medical decision making (see chart for details).   Patient presents emergency department after a fall with left  wrist pain.  Differential would include fracture, dislocation or sprain.  X-rays are negative for fracture, very mild swelling, no tenderness over the snuffbox do not suspect scaphoid injury.  We will discharge the patient home with a removable wrist splint and orthopedic follow-up.  Patient agreeable to plan.  Cheyenne Weeks was evaluated in Emergency Department on 08/03/2019 for the symptoms described in the history of present illness. She was evaluated in the context of the global COVID-19 pandemic, which necessitated consideration that the patient might be at risk for infection with the SARS-CoV-2 virus that causes COVID-19.  Institutional protocols and algorithms that pertain to the evaluation of patients at risk for COVID-19 are in a state of rapid change based on information released by regulatory bodies including the CDC and federal and state organizations. These policies and algorithms were followed during the patient's care in the ED.  ____________________________________________   FINAL CLINICAL IMPRESSION(S) / ED DIAGNOSES  Left wrist pain   Cheyenne Antis, MD 08/03/19 [phone]

## 2019-08-03 NOTE — ED Triage Notes (Signed)
Patient reports she was hanging a picture and the stool broke and she fell into the wall.  Patient reports left wrist and hand pain

## 2019-12-25 ENCOUNTER — Other Ambulatory Visit: Payer: Self-pay

## 2019-12-25 ENCOUNTER — Encounter: Payer: Self-pay | Admitting: *Deleted

## 2019-12-25 DIAGNOSIS — F17210 Nicotine dependence, cigarettes, uncomplicated: Secondary | ICD-10-CM | POA: Insufficient documentation

## 2019-12-25 DIAGNOSIS — O209 Hemorrhage in early pregnancy, unspecified: Secondary | ICD-10-CM | POA: Diagnosis present

## 2019-12-25 DIAGNOSIS — O200 Threatened abortion: Secondary | ICD-10-CM | POA: Diagnosis not present

## 2019-12-25 DIAGNOSIS — Z3A12 12 weeks gestation of pregnancy: Secondary | ICD-10-CM | POA: Diagnosis not present

## 2019-12-25 DIAGNOSIS — O30041 Twin pregnancy, dichorionic/diamniotic, first trimester: Secondary | ICD-10-CM | POA: Insufficient documentation

## 2019-12-25 LAB — CBC WITH DIFFERENTIAL/PLATELET
Abs Immature Granulocytes: 0.02 10*3/uL (ref 0.00–0.07)
Basophils Absolute: 0.1 10*3/uL (ref 0.0–0.1)
Basophils Relative: 1 %
Eosinophils Absolute: 0 10*3/uL (ref 0.0–0.5)
Eosinophils Relative: 0 %
HCT: 36.7 % (ref 36.0–46.0)
Hemoglobin: 12.3 g/dL (ref 12.0–15.0)
Immature Granulocytes: 0 %
Lymphocytes Relative: 31 %
Lymphs Abs: 2.2 10*3/uL (ref 0.7–4.0)
MCH: 30.6 pg (ref 26.0–34.0)
MCHC: 33.5 g/dL (ref 30.0–36.0)
MCV: 91.3 fL (ref 80.0–100.0)
Monocytes Absolute: 0.5 10*3/uL (ref 0.1–1.0)
Monocytes Relative: 8 %
Neutro Abs: 4.3 10*3/uL (ref 1.7–7.7)
Neutrophils Relative %: 60 %
Platelets: 375 10*3/uL (ref 150–400)
RBC: 4.02 MIL/uL (ref 3.87–5.11)
RDW: 11.6 % (ref 11.5–15.5)
WBC: 7.1 10*3/uL (ref 4.0–10.5)
nRBC: 0 % (ref 0.0–0.2)

## 2019-12-25 LAB — BASIC METABOLIC PANEL
Anion gap: 12 (ref 5–15)
BUN: 10 mg/dL (ref 6–20)
CO2: 24 mmol/L (ref 22–32)
Calcium: 9.4 mg/dL (ref 8.9–10.3)
Chloride: 105 mmol/L (ref 98–111)
Creatinine, Ser: 0.62 mg/dL (ref 0.44–1.00)
GFR calc Af Amer: >60 mL/min (ref >60)
GFR calc non Af Amer: >60 mL/min (ref >60)
Glucose, Bld: 100 mg/dL — ABNORMAL HIGH (ref 70–99)
Potassium: 4.2 mmol/L (ref 3.5–5.1)
Sodium: 141 mmol/L (ref 135–145)

## 2019-12-25 LAB — HCG, QUANTITATIVE, PREGNANCY: hCG, Beta Chain, Quant, S: 12569 m[IU]/mL — ABNORMAL HIGH (ref <5)

## 2019-12-25 LAB — ABO/RH: ABO/RH(D): O POS

## 2019-12-25 NOTE — ED Triage Notes (Addendum)
Pt reports vaginal bleeding for 3 days.  Pt had a positive home pregnancy test in January.  Pt has not seen a doctor.  Pt reports abd cramping.  Pt alert.

## 2019-12-25 NOTE — ED Notes (Signed)
POC completed at 2141 and the result was positive.

## 2019-12-26 ENCOUNTER — Encounter: Payer: Self-pay | Admitting: Radiology

## 2019-12-26 ENCOUNTER — Emergency Department: Payer: Medicaid Other

## 2019-12-26 ENCOUNTER — Emergency Department
Admission: EM | Admit: 2019-12-26 | Discharge: 2019-12-26 | Disposition: A | Discharge status: Home / Self Care | Payer: Medicaid Other | Attending: Emergency Medicine | Admitting: Emergency Medicine

## 2019-12-26 DIAGNOSIS — O30041 Twin pregnancy, dichorionic/diamniotic, first trimester: Secondary | ICD-10-CM

## 2019-12-26 DIAGNOSIS — R1031 Right lower quadrant pain: Secondary | ICD-10-CM

## 2019-12-26 DIAGNOSIS — O200 Threatened abortion: Secondary | ICD-10-CM

## 2019-12-26 DIAGNOSIS — O4690 Antepartum hemorrhage, unspecified, unspecified trimester: Secondary | ICD-10-CM

## 2019-12-26 LAB — URINALYSIS, COMPLETE (UACMP) WITH MICROSCOPIC
Bilirubin Urine: NEGATIVE
Glucose, UA: NEGATIVE mg/dL
Ketones, ur: NEGATIVE mg/dL
Leukocytes,Ua: NEGATIVE
Nitrite: NEGATIVE
Protein, ur: NEGATIVE mg/dL
Specific Gravity, Urine: 1.005 (ref 1.005–1.030)
pH: 6 (ref 5.0–8.0)

## 2019-12-26 LAB — POCT PREGNANCY, URINE: Preg Test, Ur: POSITIVE — AB

## 2019-12-26 LAB — CHLAMYDIA/NGC RT PCR (ARMC ONLY)
Chlamydia Tr: NOT DETECTED
N gonorrhoeae: NOT DETECTED

## 2019-12-26 LAB — WET PREP, GENITAL
Sperm: NONE SEEN
Trich, Wet Prep: NONE SEEN
Yeast Wet Prep HPF POC: NONE SEEN

## 2019-12-26 LAB — CHLAMYDIA/NGC RT PCR (ARMC ONLY)??????????

## 2019-12-26 NOTE — ED Provider Notes (Signed)
[facility] Emergency Department Provider Note  ____________________________________________  Time seen: Approximately 12:58 AM  I have reviewed the triage vital signs and the nursing notes.   HISTORY  Chief Complaint Vaginal Bleeding   HPI Cheyenne Weeks is a 39 y.o. female who presents for evaluation of 3 days of vaginal bleeding. Patient is [id number] currently at [redacted] weeks GA per LMP presents for evaluation of vaginal bleeding.  Started as spotting 3 days ago but has become more pronounced.  She is also having lower cramping abdominal pain that is intermittent, moderate.  No vaginal discharge.  She has not seen a doctor for this pregnancy.  No dysuria or hematuria, no chest pain or shortness of breath.  No dizziness.   Past Medical History:  Diagnosis Date  . Anxiety   . Scoliosis     Past Surgical History:  Procedure Laterality Date  . CESAREAN SECTION      Prior to Admission medications   Not on File    Allergies Patient has no known allergies.  No family history on file.  Social History Social History   Tobacco Use  . Smoking status: Current Every Day Smoker    Packs/day: 0.50    Types: Cigarettes  . Smokeless tobacco: Never Used  Substance Use Topics  . Alcohol use: Not Currently    Comment: occasionally  . Drug use: Not on file    Review of Systems  Constitutional: Negative for fever. Eyes: Negative for visual changes. ENT: Negative for sore throat. Neck: No neck pain  Cardiovascular: Negative for chest pain. Respiratory: Negative for shortness of breath. Gastrointestinal: + lower cramping abdominal pain. No vomiting or diarrhea. Genitourinary: Negative for dysuria. + vaginal bleeding Musculoskeletal: Negative for back pain. Skin: Negative for rash. Neurological: Negative for headaches, weakness or numbness. Psych: No SI or HI  ____________________________________________   PHYSICAL EXAM:  VITAL SIGNS: ED Triage  Vitals  Enc Vitals Group     BP 12/25/19 2133 122/79     Pulse Rate 12/25/19 2133 (!) 102     Resp 12/25/19 2133 20     Temp 12/25/19 2133 98 F (36.7 C)     Temp Source 12/25/19 2133 Oral     SpO2 12/25/19 2133 98 %     Weight 12/25/19 2134 135 lb (61.2 kg)     Height 12/25/19 2134 5\' 5"  (1.651 m)     Head Circumference --      Peak Flow --      Pain Score 12/25/19 2133 7     Pain Loc --      Pain Edu? --      Excl. in [place]? --     Constitutional: Alert and oriented. Well appearing and in no apparent distress. HEENT:      Head: Normocephalic and atraumatic.         Eyes: Conjunctivae are normal. Sclera is non-icteric.       Mouth/Throat: Mucous membranes are moist.       Neck: Supple with no signs of meningismus. Cardiovascular: Regular rate and rhythm. No murmurs, gallops, or rubs.  Respiratory: Normal respiratory effort. Lungs are clear to auscultation bilaterally Gastrointestinal: Soft, non tender, and non distended with positive bowel sounds. No rebound or guarding. Pelvic exam: Normal external genitalia, no rashes or lesions. Normal cervical mucus. Os closed with small amount of blood in the vaginal vault. No cervical motion tenderness.  No uterine or adnexal tenderness.   Musculoskeletal: No edema, cyanosis, or  erythema of extremities. Neurologic: Normal speech and language. Face is symmetric. Moving all extremities. No gross focal neurologic deficits are appreciated. Skin: Skin is warm, dry and intact. No rash noted. Psychiatric: Mood and affect are normal. Speech and behavior are normal.  ____________________________________________   LABS (all labs ordered are listed, but only abnormal results are displayed)  Labs Reviewed  WET PREP, GENITAL - Abnormal; Notable for the following components:      Result Value   Clue Cells Wet Prep HPF POC PRESENT (*)    WBC, Wet Prep HPF POC FEW (*)    All other components within normal limits  BASIC METABOLIC PANEL - Abnormal;  Notable for the following components:   Glucose, Bld 100 (*)    All other components within normal limits  URINALYSIS, COMPLETE (UACMP) WITH MICROSCOPIC - Abnormal; Notable for the following components:   Color, Urine STRAW (*)    APPearance CLEAR (*)    Hgb urine dipstick LARGE (*)    Bacteria, UA RARE (*)    All other components within normal limits  HCG, QUANTITATIVE, PREGNANCY - Abnormal; Notable for the following components:   hCG, Beta Chain, Quant, S 12,569 (*)    All other components within normal limits  CHLAMYDIA/NGC RT PCR ([facility] ONLY)  CBC WITH DIFFERENTIAL/PLATELET  POC URINE PREG, ED  ABO/RH   ____________________________________________  EKG  none  ____________________________________________  RADIOLOGY  I have personally reviewed the images performed during this visit and I agree with the Radiologist's read.   Interpretation by Radiologist:  US OB LESS THAN 14 WEEKS WITH OB TRANSVAGINAL  Result Date: 12/26/2019 CLINICAL DATA:  Vaginal bleeding, cramping for 3 days EXAM: TWIN OBSTETRICAL ULTRASOUND <14 WKS COMPARISON:  Prior gestational ultrasound 10/23/2018 FINDINGS: Number of IUPs:  2 Chorionicity/Amnionicity:  Dichorionic-diamniotic (thick membrane) TWIN 1 Yolk sac:  Visualized. Embryo:  Visualized. Cardiac Activity: Not Visualized. CRL:   5.1 mm   6 w 2 d TWIN 2 Yolk sac:  Possible small yolk sac (image 78/128) Embryo:  Not Visualized. Cardiac Activity: Not Visualized. MSD: 9.6 mm   5 w   5 d Subchorionic hemorrhage:  None visualized. Maternal uterus/adnexae: Maternal uterus is otherwise unremarkable. Corpus luteum in left ovary measuring up to 1.9 cm in size. Right ovary is unremarkable. No free fluid. IMPRESSION: Dichorionic-diamniotic twin gestation. Twin 1: Visible fetal pole with estimated gestational age of [redacted] weeks, 2 days by crown-rump length. No discernible cardiac activity at this time. Findings are suspicious but not yet definitive for failed  pregnancy. Recommend follow-up US in 10-14 days for definitive diagnosis. This recommendation follows SRU consensus guidelines: Diagnostic Criteria for Nonviable Pregnancy Early in the First Trimester. Malva Limes Med 2013; 347:4259-56. Twin 2: Gestational sac with estimated gestational age of [redacted] weeks 5 days by MSD. Probable early intrauterine gestational sac, but no yolk sac, fetal pole, or cardiac activity yet visualized. Can be reassessed at the time of the above mentioned follow-up ultrasound. Electronically Signed   By: Kreg Shropshire M.D.   On: 12/26/2019 02:47      ____________________________________________   PROCEDURES  Procedure(s) performed: None Procedures Critical Care performed:  None ____________________________________________   INITIAL IMPRESSION / ASSESSMENT AND PLAN / ED COURSE  39 y.o. female currently at [redacted] weeks GA per LMP with no prenatal care who presents for evaluation of 3 days of vaginal bleeding and lower abdominal cramping.  Patient is hemodynamically stable, abdomen is soft with no significant tenderness, pelvic exam showing closed os with  small amount of blood.  Labs showing no signs of acute blood loss anemia.  hCG positive at 12,569.  ABO O+ with no indication for RhoGam.  Wet prep, GC and Chlamydia are pending to rule out STDs.  Will do a transvaginal ultrasound to evaluate for an IUP, and any signs of subchorionic hemorrhage, miscarriage, or ectopic pregnancy.   _________________________ 3:03 AM on 12/26/2019 -----------------------------------------  Ultrasound showing dichorionic diamniotic twin gestation.  There is a visible fetal pole with estimated GA of [redacted] weeks and 2 days but no cardiac activity most likely consistent with failed pregnancy.  There is also gestational sac with estimated gestational age of [redacted] weeks and 5 days which could be an early IUP but also a failed pregnancy is a possibility especially with patient having a positive urine pregnancy test  at home 2 months ago.  I discussed these findings with the patient.  Discussed pelvic rest and recommended follow-up with OB/GYN in a week for repeat ultrasound.  Pelvic swabs negative.   I have reviewed patient's previous medical records and PMH.       _____________________________________________ Please note:  Patient was evaluated in Emergency Department today for the symptoms described in the history of present illness. Patient was evaluated in the context of the global COVID-19 pandemic, which necessitated consideration that the patient might be at risk for infection with the SARS-CoV-2 virus that causes COVID-19. Institutional protocols and algorithms that pertain to the evaluation of patients at risk for COVID-19 are in a state of rapid change based on information released by regulatory bodies including the CDC and federal and state organizations. These policies and algorithms were followed during the patient's care in the ED.  Some ED evaluations and interventions may be delayed as a result of limited staffing during the pandemic.   ____________________________________________   FINAL CLINICAL IMPRESSION(S) / ED DIAGNOSES   Final diagnoses:  Threatened miscarriage  Dichorionic diamniotic twin pregnancy in first trimester      NEW MEDICATIONS STARTED DURING THIS VISIT:  ED Discharge Orders    None       Note:  This document was prepared using Dragon voice recognition software and may include unintentional dictation errors.    Don Perking, [place], MD 12/26/19 [id number]

## 2019-12-26 NOTE — ED Notes (Signed)
Pt to US.

## 2019-12-26 NOTE — Discharge Instructions (Addendum)
As explained to you, your ultrasound shows 2 gestational sacs.  Most likely both were failed pregnancies however it is important that you follow-up with OB/GYN for repeat ultrasound in 1 to 2 weeks for reevaluation.  It could be that one of the twins is just a little too early to be able to be visualized on the ultrasound.  If this is a miscarriage, you should expect a much heavier menstrual period.  In the meantime avoid sex, tampons, or anything in the vagina until 48 hours of no bleeding.

## 2019-12-26 NOTE — ED Notes (Signed)
Patient discharged to home per MD order. Patient in stable condition, and deemed medically cleared by ED provider for discharge. Discharge instructions reviewed with patient/family using "Teach Back"; verbalized understanding of medication education and administration, and information about follow-up care. Denies further concerns. ° °

## 2019-12-27 ENCOUNTER — Emergency Department
Admission: EM | Admit: 2019-12-27 | Discharge: 2019-12-27 | Disposition: A | Discharge status: Home / Self Care | Payer: Medicaid Other | Attending: Emergency Medicine | Admitting: Emergency Medicine

## 2019-12-27 ENCOUNTER — Encounter: Payer: Self-pay | Admitting: Emergency Medicine

## 2019-12-27 ENCOUNTER — Emergency Department: Payer: Medicaid Other

## 2019-12-27 ENCOUNTER — Other Ambulatory Visit: Payer: Self-pay

## 2019-12-27 DIAGNOSIS — N939 Abnormal uterine and vaginal bleeding, unspecified: Secondary | ICD-10-CM | POA: Diagnosis present

## 2019-12-27 DIAGNOSIS — O039 Complete or unspecified spontaneous abortion without complication: Secondary | ICD-10-CM

## 2019-12-27 DIAGNOSIS — F17210 Nicotine dependence, cigarettes, uncomplicated: Secondary | ICD-10-CM | POA: Diagnosis not present

## 2019-12-27 DIAGNOSIS — O200 Threatened abortion: Secondary | ICD-10-CM | POA: Diagnosis not present

## 2019-12-27 DIAGNOSIS — Z3A12 12 weeks gestation of pregnancy: Secondary | ICD-10-CM | POA: Diagnosis not present

## 2019-12-27 LAB — CBC WITH DIFFERENTIAL/PLATELET
Abs Immature Granulocytes: 0.02 10*3/uL (ref 0.00–0.07)
Basophils Absolute: 0 10*3/uL (ref 0.0–0.1)
Basophils Relative: 0 %
Eosinophils Absolute: 0 10*3/uL (ref 0.0–0.5)
Eosinophils Relative: 0 %
HCT: 35.3 % — ABNORMAL LOW (ref 36.0–46.0)
Hemoglobin: 12 g/dL (ref 12.0–15.0)
Immature Granulocytes: 0 %
Lymphocytes Relative: 10 %
Lymphs Abs: 1.1 10*3/uL (ref 0.7–4.0)
MCH: 30.6 pg (ref 26.0–34.0)
MCHC: 34 g/dL (ref 30.0–36.0)
MCV: 90.1 fL (ref 80.0–100.0)
Monocytes Absolute: 0.8 10*3/uL (ref 0.1–1.0)
Monocytes Relative: 7 %
Neutro Abs: 8.9 10*3/uL — ABNORMAL HIGH (ref 1.7–7.7)
Neutrophils Relative %: 83 %
Platelets: 333 10*3/uL (ref 150–400)
RBC: 3.92 MIL/uL (ref 3.87–5.11)
RDW: 11.8 % (ref 11.5–15.5)
WBC: 10.9 10*3/uL — ABNORMAL HIGH (ref 4.0–10.5)
nRBC: 0 % (ref 0.0–0.2)

## 2019-12-27 LAB — BASIC METABOLIC PANEL
Anion gap: 7 (ref 5–15)
BUN: 12 mg/dL (ref 6–20)
CO2: 24 mmol/L (ref 22–32)
Calcium: 9.1 mg/dL (ref 8.9–10.3)
Chloride: 103 mmol/L (ref 98–111)
Creatinine, Ser: 0.58 mg/dL (ref 0.44–1.00)
GFR calc Af Amer: >60 mL/min (ref >60)
GFR calc non Af Amer: >60 mL/min (ref >60)
Glucose, Bld: 114 mg/dL — ABNORMAL HIGH (ref 70–99)
Potassium: 3.9 mmol/L (ref 3.5–5.1)
Sodium: 134 mmol/L — ABNORMAL LOW (ref 135–145)

## 2019-12-27 LAB — SAMPLE TO BLOOD BANK

## 2019-12-27 LAB — HCG, QUANTITATIVE, PREGNANCY: hCG, Beta Chain, Quant, S: 7388 m[IU]/mL — ABNORMAL HIGH (ref <5)

## 2019-12-27 MED ORDER — SODIUM CHLORIDE 0.9 % IV BOLUS
1000.0000 mL | Freq: Once | INTRAVENOUS | Status: AC
  Administered 2019-12-27: 1000 mL via INTRAVENOUS

## 2019-12-27 NOTE — ED Provider Notes (Signed)
[facility] EMERGENCY DEPARTMENT Provider Note   CSN: [id number] Arrival date & time: 12/27/19  1532     History Chief Complaint  Patient presents with  . Vaginal Bleeding    BELLAGRACE SYLVAN is a 39 y.o. female G24P2A3 and [redacted] weeks pregnant here presenting with vaginal bleeding.  Patient was seen here 2 days ago for the same thing.  Patient was diagnosed with twin pregnancy and one just has a gestational sac and the other one has fetal pole with no cardiac activity.  Patient states that she has continued bleeding.  Today she had sudden onset of cramps and much more bleeding.  She states that she is some toilet paper as she has no pads at her boyfriend's house.  She states that it soaks through her toilet papers. Patient's blood type is O positive.   The history is provided by the patient.       Past Medical History:  Diagnosis Date  . Anxiety   . Scoliosis     There are no problems to display for this patient.   Past Surgical History:  Procedure Laterality Date  . CESAREAN SECTION       OB History    Gravida  1   Para      Term      Preterm      AB      Living        SAB      TAB      Ectopic      Multiple      Live Births              History reviewed. No pertinent family history.  Social History   Tobacco Use  . Smoking status: Current Every Day Smoker    Packs/day: 0.50    Types: Cigarettes  . Smokeless tobacco: Never Used  Substance Use Topics  . Alcohol use: Not Currently    Comment: occasionally  . Drug use: Not on file    Home Medications Prior to Admission medications   Not on File    Allergies    Patient has no known allergies.  Review of Systems   Review of Systems  Genitourinary: Positive for vaginal bleeding.  All other systems reviewed and are negative.   Physical Exam Updated Vital Signs BP 135/83 (BP Location: Left Arm)   Pulse (!) 57   Temp 98.5 F (36.9 C) (Oral)   Resp 16   Ht 5'  5" (1.651 m)   Wt 63.5 kg   LMP  (LMP Unknown)   SpO2 100%   BMI 23.30 kg/m   Physical Exam Vitals and nursing note reviewed.  HENT:     Head: Normocephalic.     Right Ear: Tympanic membrane normal.     Left Ear: Tympanic membrane normal.     Nose: Nose normal.     Mouth/Throat:     Mouth: Mucous membranes are moist.  Eyes:     Extraocular Movements: Extraocular movements intact.     Pupils: Pupils are equal, round, and reactive to light.  Cardiovascular:     Rate and Rhythm: Normal rate and regular rhythm.     Pulses: Normal pulses.  Pulmonary:     Effort: Pulmonary effort is normal.  Abdominal:     General: Abdomen is flat.     Palpations: Abdomen is soft.  Genitourinary:    Comments: Vaginal- large clot vs tissue at the os that was removed  with cotton and forceps. Os is closed after the intervention.  Musculoskeletal:        General: Normal range of motion.     Cervical back: Normal range of motion.  Skin:    General: Skin is warm.     Capillary Refill: Capillary refill takes less than 2 seconds.  Neurological:     General: No focal deficit present.     Mental Status: She is alert.  Psychiatric:        Mood and Affect: Mood normal.        Behavior: Behavior normal.     ED Results / Procedures / Treatments   Labs (all labs ordered are listed, but only abnormal results are displayed) Labs Reviewed  CBC WITH DIFFERENTIAL/PLATELET  BASIC METABOLIC PANEL  HCG, QUANTITATIVE, PREGNANCY    EKG None  Radiology US OB LESS THAN 14 WEEKS WITH OB TRANSVAGINAL  Result Date: 12/26/2019 CLINICAL DATA:  Vaginal bleeding, cramping for 3 days EXAM: TWIN OBSTETRICAL ULTRASOUND <14 WKS COMPARISON:  Prior gestational ultrasound 10/23/2018 FINDINGS: Number of IUPs:  2 Chorionicity/Amnionicity:  Dichorionic-diamniotic (thick membrane) TWIN 1 Yolk sac:  Visualized. Embryo:  Visualized. Cardiac Activity: Not Visualized. CRL:   5.1 mm   6 w 2 d TWIN 2 Yolk sac:  Possible small yolk  sac (image 78/128) Embryo:  Not Visualized. Cardiac Activity: Not Visualized. MSD: 9.6 mm   5 w   5 d Subchorionic hemorrhage:  None visualized. Maternal uterus/adnexae: Maternal uterus is otherwise unremarkable. Corpus luteum in left ovary measuring up to 1.9 cm in size. Right ovary is unremarkable. No free fluid. IMPRESSION: Dichorionic-diamniotic twin gestation. Twin 1: Visible fetal pole with estimated gestational age of [redacted] weeks, 2 days by crown-rump length. No discernible cardiac activity at this time. Findings are suspicious but not yet definitive for failed pregnancy. Recommend follow-up US in 10-14 days for definitive diagnosis. This recommendation follows SRU consensus guidelines: Diagnostic Criteria for Nonviable Pregnancy Early in the First Trimester. [place] [employer] Med 2013; 300:7622-63. Twin 2: Gestational sac with estimated gestational age of [redacted] weeks 5 days by MSD. Probable early intrauterine gestational sac, but no yolk sac, fetal pole, or cardiac activity yet visualized. Can be reassessed at the time of the above mentioned follow-up ultrasound. Electronically Signed   By: Lovena Le M.D.   On: 12/26/2019 02:47    Procedures Procedures (including critical care time)  Medications Ordered in ED Medications  sodium chloride 0.9 % bolus 1,000 mL (has no administration in time range)    ED Course  I have reviewed the triage vital signs and the nursing notes.  Pertinent labs & imaging results that were available during my care of the patient were reviewed by me and considered in my medical decision making (see chart for details).    MDM Rules/Calculators/A&P                      SHAKENDRA GRIFFETH is a 39 y.o. female here with vaginal bleeding. Had failed twin pregnancy diagnosed yesterday, likely completing her miscarriage.   8:01 PM On doing her pelvic exam, she had a large clot versus tissue right around the cervix.  I was able to remove it.  Her hCG dropped to 7000 from 12000. Repeat US  showed no IUP anymore. Likely completed her miscarriage. Told her that she may be spotting for several days.   Final Clinical Impression(s) / ED Diagnoses Final diagnoses:  None    Rx / DC  Orders ED Discharge Orders    None       Charlynne Pander, MD 12/27/19 2002

## 2019-12-27 NOTE — ED Notes (Signed)
First Nurse Note: Pt to ED via [facility] from home for vaginal bleeding. Pt seen 3/19 for threatened miscarriage. Pt is having cramping and vaginal bleeding.

## 2019-12-27 NOTE — Discharge Instructions (Signed)
You had a miscarriage  Expect some spotting for several days   See your doctor   Return to ER if you have worse abdominal pain, uncontrolled bleeding, fever

## 2019-12-27 NOTE — ED Notes (Signed)
Pt taken to US via stretcher at this time.

## 2019-12-27 NOTE — ED Triage Notes (Signed)
Pt to ER states was seen here several days ago with vaginal bleeding.  Presumptive miscarriage.  States cramps are worse today and bleeding is heavier.  Pt unsure of how many pads she is using per hour.

## 2019-12-29 ENCOUNTER — Encounter: Payer: Self-pay | Admitting: Obstetrics & Gynecology

## 2020-04-05 IMAGING — US US OB < 14 WEEKS - US OB TV
1 series · 13 of 28 positions shown · non-contrast
Comparison: 12/26/2019

CLINICAL DATA: Twin pregnancy, vaginal bleeding. Down trending beta
HCG

EXAM:
TWIN OBSTETRIC <14WK US AND TRANSVAGINAL OB US
TECHNIQUE: Both transabdominal and transvaginal ultrasound examinations were
performed for complete evaluation of the gestation as well as the
maternal uterus, adnexal regions, and pelvic cul-de-sac.
Transvaginal technique was performed to assess early pregnancy.

[Series 1: us ob less than 14 weeks with ob transvaginal · 13 of 78 slices shown]
[im 3/78]
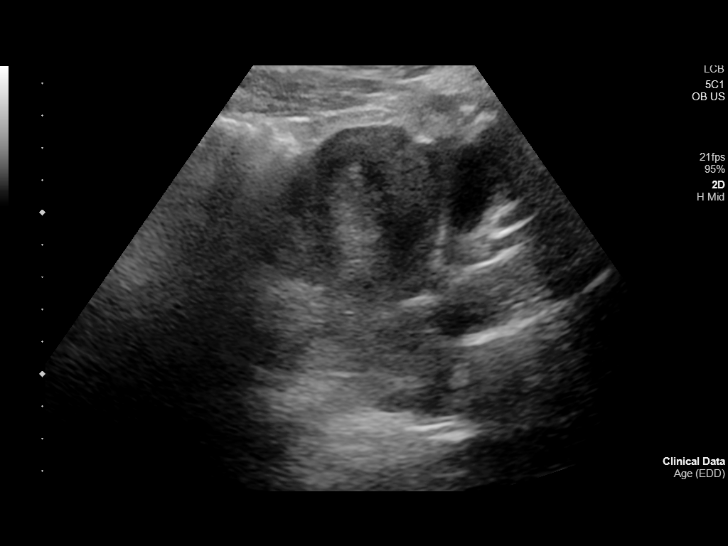
[im 9/78]
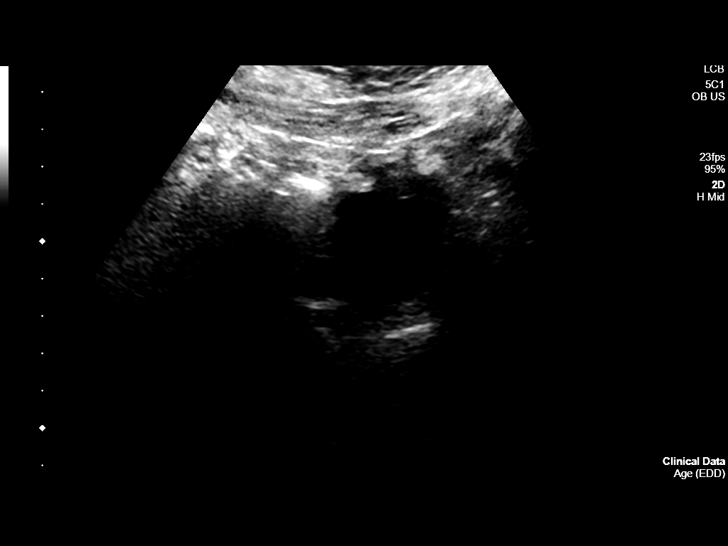
[im 15/78]
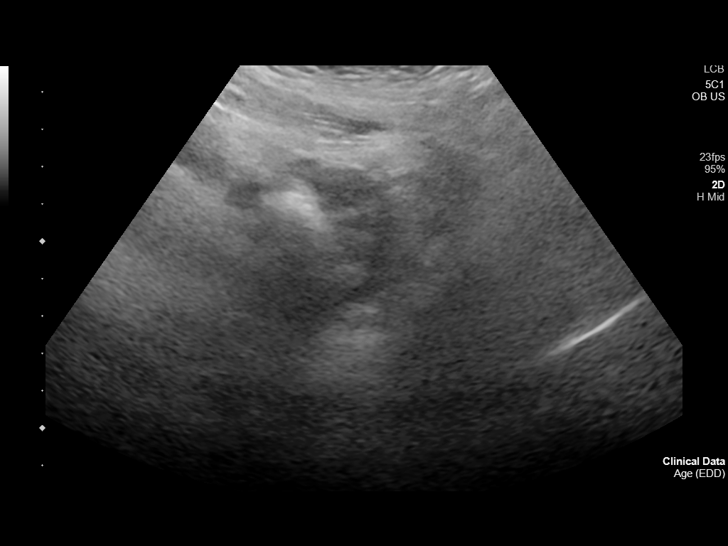
[im 20/78]
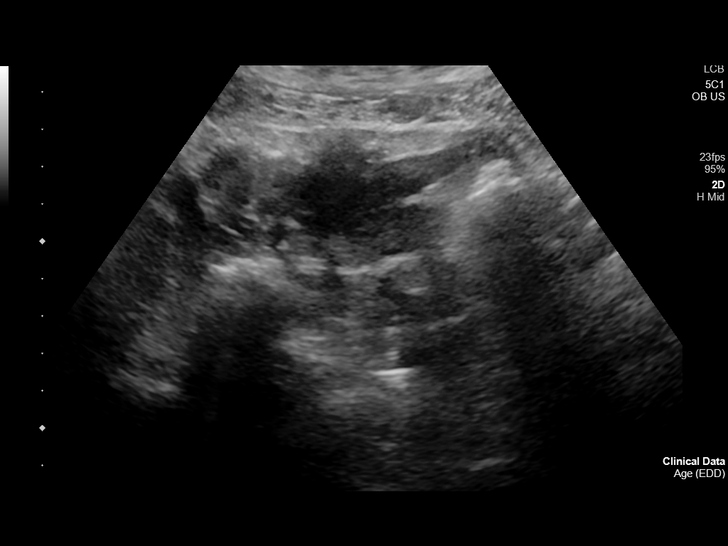
[im 26/78]
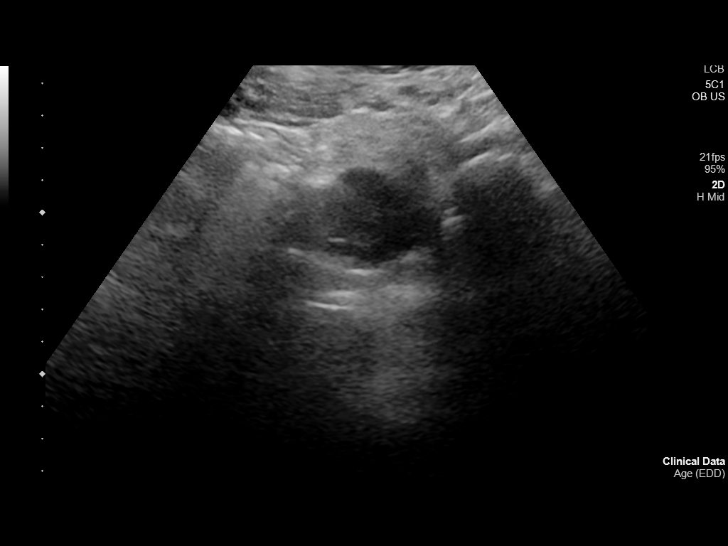
[im 32/78]
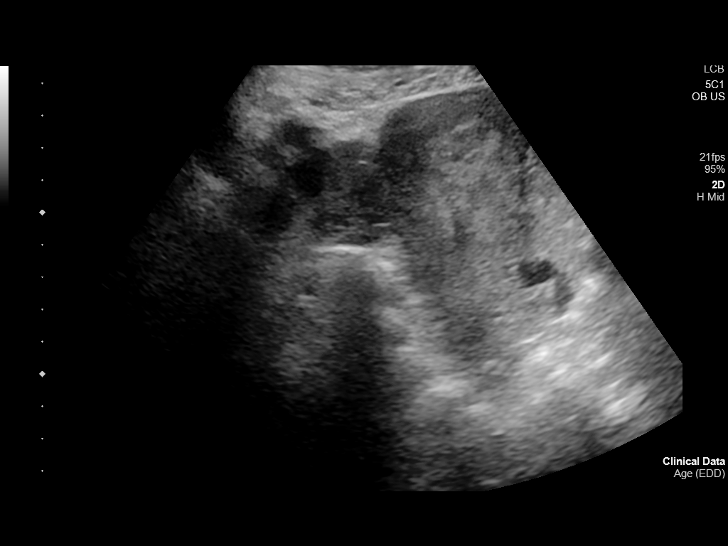
[im 40/78]
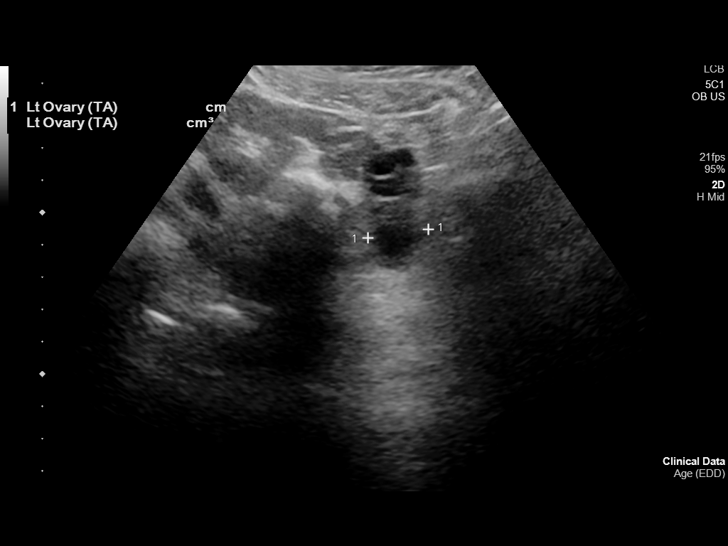
[im 46/78]
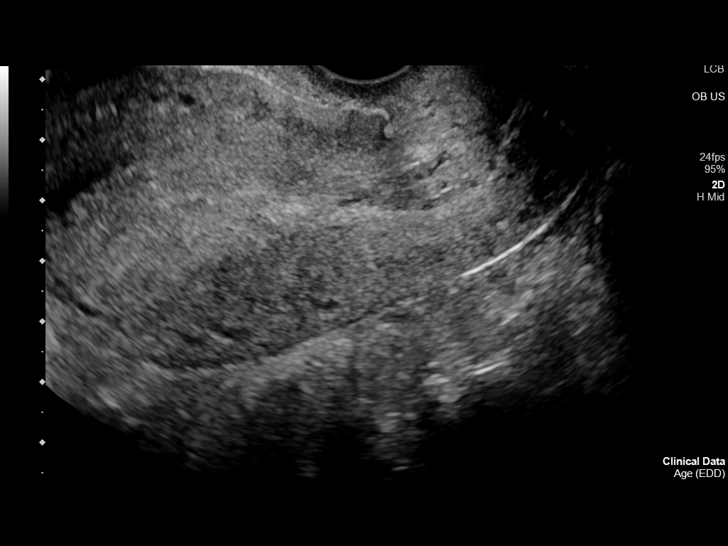
[im 52/78]
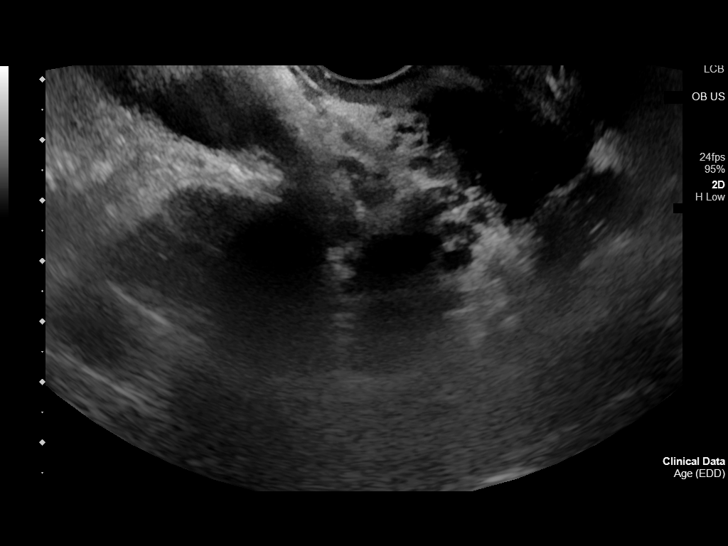
[im 58/78]
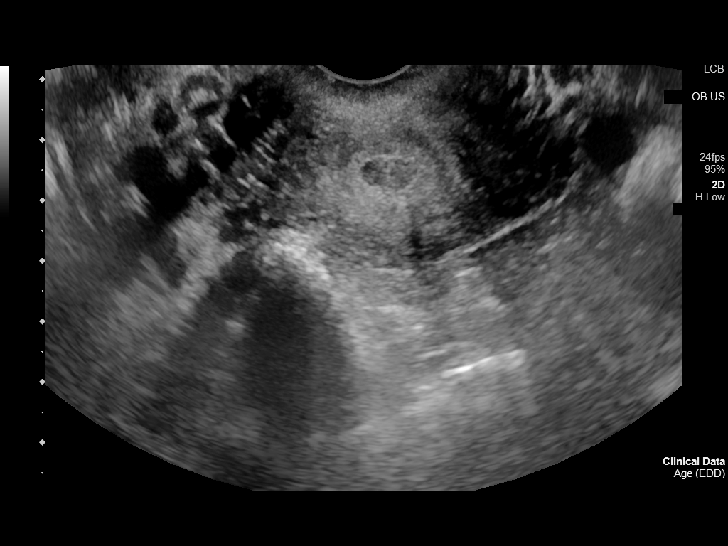
[im 63/78]
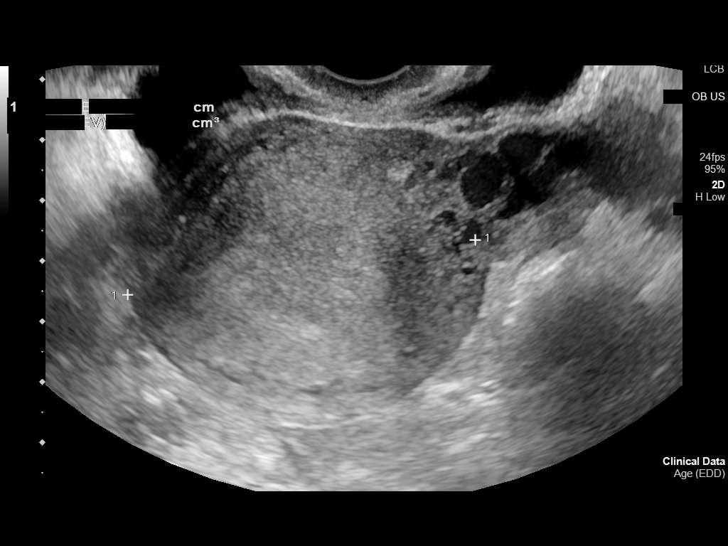
[im 69/78]
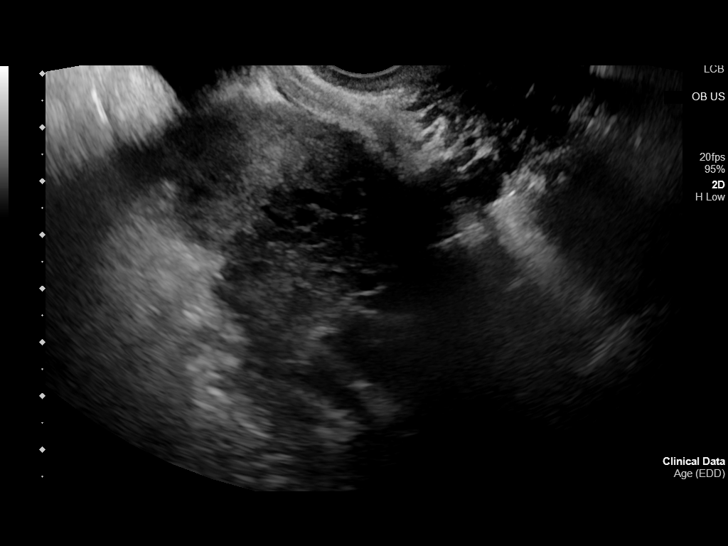
[im 75/78]
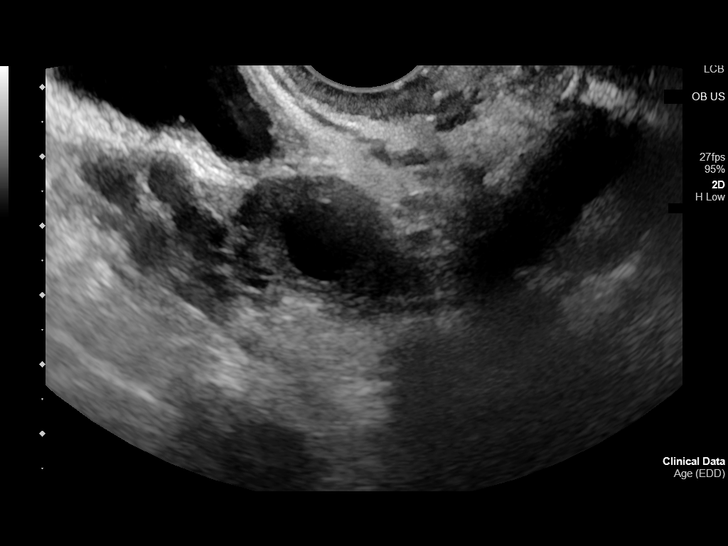

[13 of 28 positions shown; findings below may reference images not displayed]

FINDINGS: Intrauterine gestational sac: No longer visualized.

Yolk sac:  Not Visualized.

Embryo:  Not Visualized.

Cardiac Activity: Not Visualized.

Maternal uterus/adnexae: Anteverted uterus measuring 10.7 x 4.9 x
5.8 cm. Thickened endometrium measuring 1.6 cm in thickness. The
previously seen intrauterine gestational sacs are no longer evident.
A small amount of debris is seen within the lower uterine
endocervical canal.

Right ovary measures 2.3 x 1.6 x 2.0 cm and appears unremarkable.
Left ovary measures 2.6 x 1.9 x 1.8 cm and appears unremarkable.

Trace free fluid within the pelvis.
IMPRESSION: Previously seen intrauterine gestational sacs are no longer
identified. Thickened endometrium with a small amount of debris
within the lower uterine segment/endocervical canal. Findings
compatible with failed first trimester pregnancy.

## 2021-03-16 ENCOUNTER — Encounter: Payer: Medicaid Other | Admitting: Obstetrics and Gynecology

## 2021-03-28 ENCOUNTER — Other Ambulatory Visit: Payer: Self-pay

## 2021-03-28 ENCOUNTER — Encounter: Payer: Self-pay | Admitting: Obstetrics and Gynecology

## 2021-03-28 ENCOUNTER — Other Ambulatory Visit (HOSPITAL_COMMUNITY)
Admission: RE | Admit: 2021-03-28 | Discharge: 2021-03-28 | Disposition: A | Discharge status: Home / Self Care | Payer: Medicaid Other | Source: Ambulatory Visit | Attending: Obstetrics and Gynecology | Admitting: Obstetrics and Gynecology

## 2021-03-28 ENCOUNTER — Ambulatory Visit (INDEPENDENT_AMBULATORY_CARE_PROVIDER_SITE_OTHER): Payer: Medicaid Other | Admitting: Obstetrics and Gynecology

## 2021-03-28 VITALS — BP 110/70 | Ht 65.0 in | Wt 146.8 lb

## 2021-03-28 DIAGNOSIS — Z3009 Encounter for other general counseling and advice on contraception: Secondary | ICD-10-CM

## 2021-03-28 DIAGNOSIS — Z124 Encounter for screening for malignant neoplasm of cervix: Secondary | ICD-10-CM

## 2021-03-28 DIAGNOSIS — R875 Abnormal microbiological findings in specimens from female genital organs: Secondary | ICD-10-CM | POA: Diagnosis not present

## 2021-03-28 DIAGNOSIS — Z3490 Encounter for supervision of normal pregnancy, unspecified, unspecified trimester: Secondary | ICD-10-CM

## 2021-03-28 DIAGNOSIS — O3680X0 Pregnancy with inconclusive fetal viability, not applicable or unspecified: Secondary | ICD-10-CM

## 2021-03-28 DIAGNOSIS — Z7251 High risk heterosexual behavior: Secondary | ICD-10-CM

## 2021-03-28 LAB — POCT URINE PREGNANCY: Preg Test, Ur: NEGATIVE

## 2021-03-28 MED ORDER — LEVONORGESTREL 1.5 MG PO TABS: 1.5000 mg | ORAL_TABLET | Freq: Once | ORAL | 11 refills | Status: AC

## 2021-03-28 MED ORDER — FLUCONAZOLE 150 MG PO TABS: 150.0000 mg | ORAL_TABLET | ORAL | 0 refills | Status: AC

## 2021-03-28 MED ORDER — ETONOGESTREL-ETHINYL ESTRADIOL 0.12-0.015 MG/24HR VA RING: VAGINAL_RING | VAGINAL | 12 refills | Status: DC

## 2021-03-28 NOTE — Patient Instructions (Signed)
Etonogestrel; Ethinyl Estradiol Vaginal Ring What is this medication? ETONOGESTREL; ETHINYL ESTRADIOL (et oh noe JES trel; ETH in il es tra DYE ole) prevents ovulation and pregnancy. It belongs to a group of medications called oral contraceptives. It is a combination of the hormones estrogen and progestin. This medicine may be used for other purposes; ask your health care provider or pharmacist if you have questions. COMMON BRAND NAME(S): EluRyng, NuvaRing What should I tell my care team before I take this medication? They need to know if you have any of these conditions: Abnormal vaginal bleeding Blood vessel disease or blood clots Breast, cervical, endometrial, ovarian, liver, or uterine cancer Diabetes (high blood sugar) Gallbladder disease Having surgery Heart disease or recent heart attack High blood pressure High cholesterol or triglycerides History of irregular heartbeat or heart valve problems Kidney disease Liver disease Migraine headaches Protein C/S deficiency Recently had a baby, miscarriage, or abortion Stroke Systemic lupus erythematosus (SLE) Tobacco smoker An unusual or allergic reaction to estrogens, progestins, other medications, foods, dyes, or preservatives Pregnant or trying to get pregnant Breast-feeding How should I use this medication? Insert the ring into your vagina as directed. Follow the directions on the prescription label. The ring will remain place for 3 weeks and is then removed for a 1-week break. A new ring is inserted 1 week after the last ring was removed, on the same day of the week. Check often to make sure the ring is still in place. If the ring was out of the vagina for an unknown amount of time, you may not be protected from pregnancy. Perform a pregnancy test and call your care team. Do not use more often than directed. A patient package insert for the product will be given with each prescription and refill. Read this sheet carefully each time.  The sheet may change frequently. Contact your care team regarding the use of this medication in children. Special care may be needed. Overdosage: If you think you have taken too much of this medicine contact a poison control center or emergency room at once. NOTE: This medicine is only for you. Do not share this medicine with others. What if I miss a dose? You will need to use the ring exactly as directed. It is very important to follow the schedule every cycle. If you do not use the ring as directed, you may not be protected from pregnancy. If the ring should slip out, is lost, or if you leave it in longer or shorter than you should, contact your care team for advice. What may interact with this medication? Do not take this medication with the following: Dasabuvir; ombitasvir; paritaprevir; ritonavir Ombitasvir; paritaprevir; ritonavir Vaginal lubricants or other vaginal products that are oil-based or silicone-based This medication may also interact with the following: Acetaminophen Antibiotics or medications for infections, especially rifampin, rifabutin, rifapentine, and griseofulvin, and possibly penicillins or tetracyclines Aprepitant or fosaprepitant Armodafinil Ascorbic acid (vitamin C) Barbiturate medications, such as phenobarbital or primidone Bosentan Certain antiviral medications for hepatitis, HIV or AIDS Certain medications for cancer treatment Certain medications for seizures like carbamazepine, clobazam, felbamate, lamotrigine, oxcarbazepine, phenytoin, rufinamide, topiramate Certain medications for treating high cholesterol Cyclosporine Dantrolene Elagolix Flibanserin Grapefruit juice Lesinurad Medications for diabetes Medications to treat fungal infections, such as griseofulvin, miconazole, fluconazole, ketoconazole, itraconazole, posaconazole or voriconazole Mifepristone Mitotane Modafinil Morphine Mycophenolate St. John's wort Tamoxifen Temazepam Theophylline  or aminophylline Thyroid hormones Tizanidine Tranexamic acid Ulipristal Warfarin This list may not describe all possible interactions. Give your health   medicines, herbs, non-prescription drugs, or dietary supplements you use. Also tell them if you smoke, drink alcohol, or use illegaldrugs. Some items may interact with your medicine. What should I watch for while using this medication? Visit your care team for regular checks on your progress. You will need aregular breast and pelvic exam and Pap smear while on this medication. Check with your care team to see if you need an additional method of contraception during the first cycle that you use this ring. Female condoms (made with natural rubber latex, polyisoprene, and polyurethane) and spermicides may be used. Do not use a diaphragm, cervical cap, or a female condom, as the ringcan interfere with these birth control methods and their proper placement. If you have any reason to think you are pregnant, stop using this medicationright away and contact your care team. If you are using this medication for hormone related problems, it may takeseveral cycles of use to see improvement in your condition. Smoking increases the risk of getting a blood clot or having a stroke while you are using hormonal birth control, especially if you are older than 40 yearsold. You are strongly advised not to smoke. Some women are prone to getting dark patches on the skin of the face (cholasma). Your risk of getting chloasma with this medication is higher if you had chloasma during a pregnancy. Keep out of the sun. If you cannot avoid being in the sun, wear protective clothing and use sunscreen. Do not use sun lamps ortanning beds/booths. This medication can make your body retain fluid, making your fingers, hands, or ankles swell. Your blood pressure can go up. Contact your care team if you feelyou are retaining fluid. If you are going to have  elective surgery, you may need to stop using thismedication before the surgery. Consult your care team for advice. This medication does not protect you against HIV infection (AIDS) or any othersexually transmitted infections. What side effects may I notice from receiving this medication? Side effects that you should report to your care team as soon as possible: Allergic reactions-skin rash, itching, hives, swelling of the face, lips, tongue, or throat Blood clot-pain, swelling, or warmth in the leg, shortness of breath, chest pain Gallbladder problems-severe stomach pain, nausea, vomiting, fever Increase in blood pressure Liver injury-right upper belly pain, loss of appetite, nausea, light-colored stool, dark yellow or brown urine, yellowing skin or eyes, unusual weakness or fatigue New or worsening migraines or headaches Stroke-sudden numbness or weakness of the face, arm, or leg, trouble speaking, confusion, trouble walking, loss of balance or coordination, dizziness, severe headache, change in vision Toxic shock syndrome-fever, headache, general discomfort and fatigue, vomiting, diarrhea, rash or peeling of the skin over hands or feet Unusual vaginal discharge, itching, or odor Vaginal pain, irritation, or sores Worsening mood, feelings of depression Side effects that usually do not require medical attention (report to your careteam if they continue or are bothersome): Breast pain or tenderness Dark patches of skin on the face or other sun-exposed areas Irregular menstrual cycles or spotting Nausea Weight gain This list may not describe all possible side effects. Call your doctor for medical advice about side effects. You may report side effects to FDA at1-800-FDA-1088. Where should I keep my medication? Keep out of the reach of children and pets. Store unopened medication for up to 4 months at room temperature at 15 and 30 degrees C (59 and 86 degrees F). Protect from light. Do not store  above 30 degrees C (86  degrees F). Throw away any unused medication 4 months after the dispense date or the expiration date, whichever comes first. A ring may only be used for 1 cycle (1 month). After the 3-week cycle, a used ring is removed and should be placed in the re-closable foil pouch and discarded in the trash outof reach of children and pets. Do NOT flush down the toilet. NOTE: This sheet is a summary. It may not cover all possible information. If you have questions about this medicine, talk to your doctor, pharmacist, orhealth care provider.  2022 Elsevier/Gold Standard (2020-10-20 15:11:57) Emergency Contraception Emergency contraception refers to birth control methods that prevent pregnancy after unprotected sex. Emergency contraception may be recommended: When a condom breaks. After a sexual assault. If you forgot to take your birth control pill. If you used inadequate contraception during sex. Emergency contraception is most effective if used as soon as possible after sex. It is important to note that it does not protect against STIs (sexually transmitted infections). Do not use emergency contraception as your only form of birth control. Types of emergency contraceptives Emergency contraception must be used as soon as possible after having unprotected sex and within 5 days after the sex. The sooner you use emergency contraception after unprotected sex, the more effective it is. The following types of emergency contraception are available: Hormonal pills that work by preventing the ovaries from releasing an egg (ovulation) and preventing fertilization of an egg. There are two types of hormonal pills: A pill that contains high doses of estrogen and progesterone. A pill that contains only progesterone. This may be a single pill or two pills taken 12-24 hours apart. A nonhormonal pill that works by preventing progesterone from having its normal effect on ovulation and the lining of the  uterus (endometrium). A prescription for this medicine is usually required. A nonhormonal medical device that is inserted into the uterus (copper intrauterine device, IUD). The copper in the IUD causes the sperm to be less able to fertilize the egg. A health care provider must insert the IUD. Most types of emergency contraceptive pills are available without a prescription or a visit with your health care provider. If you are younger than17, you may need a prescription. Talk with your pharmacist about your options. Side effects Ask your health care provider about the possible side effects of emergency contraceptives. Side effects may include: Abdominal pain and cramps. Nausea and vomiting. Breast tenderness. Headache. Dizziness. Tiredness (fatigue). Irregular bleeding or spotting. If you take emergency contraceptive pills while you are pregnant, it will notend your pregnancy or harm your baby. Follow these instructions at home: Take over-the-counter and prescription medicines only as told by your health care provider. Eat something before taking the emergency contraception pills to help prevent nausea. If you feel tired or dizzy, rest until you feel better. If you used a hormonal emergency contraceptive pill, continue using your normal method of birth control. Be sure to use a barrier method (such as condoms) for contraception for at least 7 days. If you used the nonhormonal emergency contraceptive pill, do not go back to your normal hormonal contraception pills for at least 5 days after taking the emergency contraceptive. Be sure to use a barrier method (such as condoms) for contraception until the next menstrual period. Contact a health care provider if: You vomit within 3 hours after taking a pill. You may need to take another pill. You have a severe headache. You have vaginal bleeding that does not stop. It  has been 21 days since you took an emergency contraception pill and you have not had  a menstrual period. Get help right away if: You have chest pain. You have leg pain. You have numbness or weakness of your arms or legs. You have slurred speech. You have visual problems. These symptoms may represent a serious problem that is an emergency. Do not wait to see if the symptoms will go away. Get medical help right away. Call your local emergency services (911 in the [place] Do not drive yourself to the hospital. Summary Emergency contraceptives prevent pregnancy after unprotected sex. Emergency contraception will not work if you are already pregnant and will not harm the baby if you are pregnant. Some emergency contraceptives are available from your local pharmacy without a prescription. Talk to your health care provider about the type of emergency contraceptives that are best for you. This information is not intended to replace advice given to you by your health care provider. Make sure you discuss any questions you have with your healthcare provider. Document Revised: 04/07/2020 Document Reviewed: 04/07/2020 Elsevier Patient Education  2022 [employer].

## 2021-03-28 NOTE — Progress Notes (Signed)
Patient ID: Cheyenne Weeks, female   DOB: July 17, 1981, 40 y.o.   MRN: [id number]  Reason for Consult: Gynecologic Exam   Referred by [facility]  Subjective:     HPI:  Cheyenne Weeks is a 40 y.o. female patient presents today for a referral of possible pregnancy.  Patient was seen by her primary care physician and had a beta-hCG of 7 on 02/28/2021.  She reports that she had vaginal bleeding about 2 weeks ago.  She is uncertain how long this bleeding lasted for.  She reports that she has been having somewhat irregular menstrual cycles and does not recall having a menstrual cycle in April or May.  She reports that she would like to avoid pregnancy and would like to start contraception with NuvaRing.  She has some concerns of left-sided pain.  She reports that she is with a new or different partner.  She has also noticed abnormal odor and discharge.  Several years ago she had a retained tampon and she wonders if this is happening again.  She is also concerned she may be having symptoms of prolapse.  She would like STD testing today however she would like to avoid any blood test such as HIV hepatitis and syphilis.  She reports that she believes she has had at least 3 early miscarriages but she is uncertain regarding the years.  She reports that last year in 2021 she miscarried twins at 60 to [redacted] weeks gestational age.  Gynecological History  No LMP recorded. Menarche: 13  She denies passage of large clots She denies sensations of gushing or flooding of blood. She denies accidents where she bleeds through her clothing. She denies that she changes a saturated pad or tampon more frequently than every hour.  She denies that pain from her periods limits her activities.  History of fibroids, polyps, or ovarian cysts? : no  History of PCOS? no Hstory of Endometriosis? no History of abnormal pap smears? yes Have you had any sexually transmitted infections in the past? Left blank by  patient  Last Pap: unknown  She identifies as a female. She is sexually active with men.   She denies dyspareunia. She denies postcoital bleeding.  She currently uses none for contraception.   Obstetrical History OB History  Gravida Para Term Preterm AB Living  7 2 2   3 2   SAB IAB Ectopic Multiple Live Births  3       2    # Outcome Date GA Lbr Len/2nd Weight Sex Delivery Anes PTL Lv  7 Term 07/06/13    M Vag-Spont   LIV  6 Term 12/22/11    F CS-Unspec   LIV     Complications: Pre-eclampsia  5 Gravida           4 Gravida           3 SAB           2 SAB           1 SAB              Past Medical History:  Diagnosis Date   Anxiety    Scoliosis    No family history on file. Past Surgical History:  Procedure Laterality Date   CESAREAN SECTION      Short Social History:  Social History   Tobacco Use   Smoking status: Every Day    Packs/day: 0.50    Pack years: 0.00  Types: Cigarettes   Smokeless tobacco: Never  Substance Use Topics   Alcohol use: Not Currently    Comment: occasionally    No Known Allergies  No current outpatient medications on file.   No current facility-administered medications for this visit.    Review of Systems  Constitutional: Positive for fatigue. Negative for chills, fever and unexpected weight change.  HENT: Negative for trouble swallowing.  Eyes: Negative for loss of vision.  Respiratory: Negative for cough, shortness of breath and wheezing.  Cardiovascular: Negative for chest pain, leg swelling, palpitations and syncope.  GI: Negative for abdominal pain, blood in stool, diarrhea, nausea and vomiting.  GU: Positive for frequency. Negative for difficulty urinating, dysuria and hematuria.  Musculoskeletal: Negative for back pain, leg pain and joint pain.  Skin: Negative for rash.  Neurological: Negative for dizziness, headaches, light-headedness, numbness and seizures.  Hematologic: Positive for bruises/bleeds easily.   Psychiatric: Negative for behavioral problem, confusion, depressed mood and sleep disturbance.       Objective:  Objective   There were no vitals filed for this visit. There is no height or weight on file to calculate BMI.  Physical Exam Vitals and nursing note reviewed. Exam conducted with a chaperone present.  Constitutional:      Appearance: Normal appearance. She is well-developed.  HENT:     Head: Normocephalic and atraumatic.  Eyes:     Extraocular Movements: Extraocular movements intact.     Pupils: Pupils are equal, round, and reactive to light.  Cardiovascular:     Rate and Rhythm: Normal rate and regular rhythm.  Pulmonary:     Effort: Pulmonary effort is normal. No respiratory distress.     Breath sounds: Normal breath sounds.  Abdominal:     General: Abdomen is flat.     Palpations: Abdomen is soft.  Genitourinary:    Comments: External: Normal appearing vulva. No lesions noted.  Speculum examination: Normal appearing cervix. No blood in the vaginal vault. Scant white discharge.   Bimanual examination: Uterus midline, non-tender, normal in size, shape and contour.  No CMT. No adnexal masses. No adnexal tenderness. Pelvis not fixed.  No evidence of prolapse with patient straining  Breast exam: exam not performed Musculoskeletal:        General: No signs of injury.  Skin:    General: Skin is warm and dry.  Neurological:     Mental Status: She is alert and oriented to person, place, and time.  Psychiatric:        Behavior: Behavior normal.        Thought Content: Thought content normal.        Judgment: Judgment normal.    Assessment/Plan:    40 yo  Questionable early stage pregnancy- patient had a positive beta hCG on 02/28/2021 with her primary care office which was 7.  She reports that roughly 2 weeks ago she had vaginal bleeding.  Today her urine pregnancy test is negative.  We will repeat her beta hCG value.  Patient is likely not pregnant at this time  although it is not yet excluded.  She reports that she would not like to be pregnant, but is having unprotected intercourse.  She reports that her last unprotected intercourse was 3 days ago.  We discussed options for emergency contraception and I recommended the copper IUD is the most effective option.  She declines a copper IUD today but would like a prescription for Plan B.  Advised that the patient can take Plan B  today and then start her new method of birth control.  Patient would like to start a NuvaRing.  Advised that the patient should repeat a pregnancy test at home and 2 weeks to exclude pregnancy.  After we had a longer conversation the patient indicated that she may like to return for a Nexplanon placement.  She will schedule a visit for this if desired. STD/ vaginitis testing today.  Patient declines blood laboratory test. She suspects a yeast infection. She would like a prescription for diflucan which was sent. Pap smear today  More than 45 minutes were spent face to face with the patient in the room, reviewing the medical record, labs and images, and coordinating care for the patient. The plan of management was discussed in detail and counseling was provided.       Adelene Idler MD [facility], [facility] 03/28/2021 8:22 AM

## 2021-03-29 LAB — BETA HCG QUANT (REF LAB): hCG Quant: <1 m[IU]/mL

## 2021-03-31 LAB — NUSWAB BV AND CANDIDA, NAA
Atopobium vaginae: HIGH Score — AB
BVAB 2: HIGH Score — AB
Candida albicans, NAA: NEGATIVE
Candida glabrata, NAA: NEGATIVE

## 2021-04-01 ENCOUNTER — Other Ambulatory Visit: Payer: Self-pay | Admitting: Obstetrics and Gynecology

## 2021-04-01 DIAGNOSIS — B9689 Other specified bacterial agents as the cause of diseases classified elsewhere: Secondary | ICD-10-CM

## 2021-04-01 DIAGNOSIS — A5909 Other urogenital trichomoniasis: Secondary | ICD-10-CM

## 2021-04-01 LAB — CYTOLOGY - PAP
Chlamydia: NEGATIVE
Comment: NEGATIVE
Comment: NEGATIVE
Comment: NEGATIVE
Comment: NORMAL
Diagnosis: UNDETERMINED — AB
High risk HPV: NEGATIVE
Neisseria Gonorrhea: NEGATIVE
Trichomonas: POSITIVE — AB

## 2021-04-01 MED ORDER — METRONIDAZOLE 500 MG PO TABS: 500.0000 mg | ORAL_TABLET | Freq: Two times a day (BID) | ORAL | 0 refills | Status: DC

## 2021-04-04 ENCOUNTER — Other Ambulatory Visit: Payer: Self-pay

## 2021-04-04 DIAGNOSIS — A5909 Other urogenital trichomoniasis: Secondary | ICD-10-CM

## 2021-04-04 DIAGNOSIS — N760 Acute vaginitis: Secondary | ICD-10-CM

## 2021-04-05 MED ORDER — METRONIDAZOLE 500 MG PO TABS: 500.0000 mg | ORAL_TABLET | Freq: Two times a day (BID) | ORAL | 0 refills | Status: DC

## 2021-04-12 ENCOUNTER — Ambulatory Visit: Payer: Medicaid Other | Admitting: Obstetrics and Gynecology

## 2021-05-04 ENCOUNTER — Other Ambulatory Visit: Payer: Self-pay

## 2021-05-04 ENCOUNTER — Other Ambulatory Visit: Payer: Self-pay | Admitting: Obstetrics and Gynecology

## 2021-05-04 DIAGNOSIS — B9689 Other specified bacterial agents as the cause of diseases classified elsewhere: Secondary | ICD-10-CM

## 2021-05-04 DIAGNOSIS — A5909 Other urogenital trichomoniasis: Secondary | ICD-10-CM

## 2021-05-04 DIAGNOSIS — N760 Acute vaginitis: Secondary | ICD-10-CM

## 2021-11-16 ENCOUNTER — Encounter: Payer: Self-pay | Admitting: Obstetrics and Gynecology

## 2021-11-16 ENCOUNTER — Other Ambulatory Visit: Payer: Self-pay

## 2021-11-16 ENCOUNTER — Ambulatory Visit (INDEPENDENT_AMBULATORY_CARE_PROVIDER_SITE_OTHER): Payer: Medicaid Other | Admitting: Obstetrics and Gynecology

## 2021-11-16 VITALS — BP 120/80 | Ht 65.0 in | Wt 142.0 lb

## 2021-11-16 DIAGNOSIS — R350 Frequency of micturition: Secondary | ICD-10-CM | POA: Diagnosis not present

## 2021-11-16 DIAGNOSIS — N898 Other specified noninflammatory disorders of vagina: Secondary | ICD-10-CM | POA: Diagnosis not present

## 2021-11-16 DIAGNOSIS — Z113 Encounter for screening for infections with a predominantly sexual mode of transmission: Secondary | ICD-10-CM

## 2021-11-16 DIAGNOSIS — A599 Trichomoniasis, unspecified: Secondary | ICD-10-CM | POA: Insufficient documentation

## 2021-11-16 LAB — POCT WET PREP WITH KOH
Clue Cells Wet Prep HPF POC: NEGATIVE
KOH Prep POC: NEGATIVE
Trichomonas, UA: NEGATIVE
Yeast Wet Prep HPF POC: NEGATIVE

## 2021-11-16 NOTE — Progress Notes (Signed)
[place]   Chief Complaint  Patient presents with   Vaginal irritation    Swelling, no discharge or itchiness x 2 weeks   Urinary Tract Infection    Frequency and burning urinating x 2 weeks    HPI:      [name] is a 41 y.o. [id number] whose LMP was Patient's last menstrual period was 10/23/2021 (approximate)., presents today for vaginal irritation and swelling, no d/c or fishy odor, for 2 wks. Treated with monistat-1 twice (last one last night) without relief. Also with dysuria and frequency, no hematuria/urgency. Took a few days leftover abx for ear pain before sx started. Hx of trich 6/22 and treated, hasn't had TOC. Thinks partner did treatment. Also hx of BV on culture. Hx of chronic pelvic and LBP pain, no changes, no fevers.  She is sex active, no new partners. Not using birth control, declines for now. Neg UPT this AM. Menses are monthly, lasting 5-7 days, mod flow, no BTB, mild dysmen, no meds needed. LMP was heavier with clotting and more dysmen.   ASCUS/neg HPV DNA pap  Patient Active Problem List   Diagnosis Date Noted   Trichomoniasis 11/16/2021    Past Surgical History:  Procedure Laterality Date   CESAREAN SECTION      History reviewed. No pertinent family history.  Social History   Socioeconomic History   Marital status: Single    Spouse name: Not on file   Number of children: Not on file   Years of education: Not on file   Highest education level: Not on file  Occupational History   Not on file  Tobacco Use   Smoking status: Every Day    Packs/day: 0.50    Types: Cigarettes   Smokeless tobacco: Never  Vaping Use   Vaping Use: Never used  Substance and Sexual Activity   Alcohol use: Yes    Comment: occasionally   Drug use: Never   Sexual activity: Yes    Birth control/protection: None  Other Topics Concern   Not on file  Social History Narrative   Not on file   Social Determinants of Health   Financial Resource  Strain: Not on file  Food Insecurity: Not on file  Transportation Needs: Not on file  Physical Activity: Not on file  Stress: Not on file  Social Connections: Not on file  Intimate Partner Violence: Not on file    Outpatient Medications Prior to Visit  Medication Sig Dispense Refill   amphetamine-dextroamphetamine (ADDERALL) 20 MG tablet Take 20 mg by mouth 3 (three) times daily.     LORazepam (ATIVAN) 1 MG tablet Take 1 mg by mouth 2 (two) times daily.     etonogestrel-ethinyl estradiol (NUVARING) 0.12-0.015 MG/24HR vaginal ring Insert vaginally and leave in place for 3 consecutive weeks, then remove for 1 week. 1 each 12   metroNIDAZOLE (FLAGYL) 500 MG tablet Take 1 tablet (500 mg total) by mouth 2 (two) times daily. 14 tablet 0   No facility-administered medications prior to visit.      ROS:  Review of Systems  Constitutional:  Negative for fever.  Gastrointestinal:  Negative for blood in stool, constipation, diarrhea, nausea and vomiting.  Genitourinary:  Positive for dyspareunia, dysuria, frequency and pelvic pain. Negative for flank pain, hematuria, urgency, vaginal bleeding, vaginal discharge and vaginal pain.  Musculoskeletal:  Positive for back pain.  Skin:  Negative for rash.  BREAST: No symptoms   OBJECTIVE:   Vitals:  BP 120/80    Ht 5\' 5"  (1.651 m)    Wt 142 lb (64.4 kg)    LMP 10/23/2021 (Approximate)    Breastfeeding No    BMI 23.63 kg/m   Physical Exam Vitals reviewed.  Constitutional:      Appearance: She is well-developed.  Pulmonary:     Effort: Pulmonary effort is normal.  Genitourinary:    General: Normal vulva.     Pubic Area: No rash.      Labia:        Right: No rash, tenderness or lesion.        Left: No rash, tenderness or lesion.      Vagina: Vaginal discharge present. No erythema or tenderness.     Cervix: Normal.     Uterus: Normal. Not enlarged and not tender.      Adnexa: Right adnexa normal and left adnexa normal.       Right: No  mass or tenderness.         Left: No mass or tenderness.       Comments: NEG EXT EXAM; NO ERYTHEMA/SWELLING; EXTRA VAG D/C DUE TO MONISTAT OVULE Musculoskeletal:        General: Normal range of motion.     Cervical back: Normal range of motion.  Skin:    General: Skin is warm and dry.  Neurological:     General: No focal deficit present.     Mental Status: She is alert and oriented to person, place, and time.  Psychiatric:        Mood and Affect: Mood normal.        Behavior: Behavior normal.        Thought Content: Thought content normal.        Judgment: Judgment normal.    Results: Results for orders placed or performed in visit on 11/16/21 (from the past 24 hour(s))  POCT Wet Prep with KOH     Status: Normal   Collection Time: 11/16/21  4:21 PM  Result Value Ref Range   Trichomonas, UA Negative    Clue Cells Wet Prep HPF POC neg    Epithelial Wet Prep HPF POC     Yeast Wet Prep HPF POC neg    Bacteria Wet Prep HPF POC     RBC Wet Prep HPF POC     WBC Wet Prep HPF POC     KOH Prep POC Negative Negative     Assessment/Plan: Vaginal itching - Plan: NuSwab Vaginitis Plus (VG+), POCT Wet Prep with KOH, neg exam, neg wet prep. Check culture/STDs. Will f/u with results. If neg, pt already tx with monistat and will see if sx improve.   Trichomoniasis - Plan: NuSwab Vaginitis Plus (VG+), TOC today  Screening for STD (sexually transmitted disease) - Plan: NuSwab Vaginitis Plus (VG+)  Urinary frequency - Plan: Urine Culture; neg UA. Check C&S. Will f/u if pos. Could also be vaginitis related if neg.     Return if symptoms worsen or fail to improve.  Aul Mangieri B. Eastyn Dattilo, PA-C 11/16/2021 4:22 PM

## 2021-11-20 LAB — NUSWAB VAGINITIS PLUS (VG+)
Candida albicans, NAA: NEGATIVE
Candida glabrata, NAA: NEGATIVE
Chlamydia trachomatis, NAA: NEGATIVE
Neisseria gonorrhoeae, NAA: NEGATIVE
Trich vag by NAA: NEGATIVE

## 2021-11-21 ENCOUNTER — Telehealth: Payer: Self-pay

## 2021-11-21 LAB — URINE CULTURE

## 2021-11-21 MED ORDER — NITROFURANTOIN MONOHYD MACRO 100 MG PO CAPS: 100.0000 mg | ORAL_CAPSULE | Freq: Two times a day (BID) | ORAL | 0 refills | Status: AC

## 2021-11-21 NOTE — Telephone Encounter (Signed)
Patient aware labs showed UTI & abx rx'd.

## 2021-11-21 NOTE — Progress Notes (Signed)
Pls notify pt C&S showed UTI and abx eRxd. Thx.

## 2021-11-21 NOTE — Addendum Note (Signed)
Addended by: Althea Grimmer B on: 11/21/2021 11:43 AM   Modules accepted: Orders

## 2021-11-21 NOTE — Progress Notes (Signed)
Called pt, no answer, [facility]. 

## 2021-11-21 NOTE — Telephone Encounter (Signed)
Returning [employer]. Cb#[phone]

## 2021-11-21 NOTE — Telephone Encounter (Signed)
-----   Message from Rica Records, PA-C sent at 11/21/2021 11:44 AM EST ----- Pls notify pt C&S showed UTI and abx eRxd. Thx.

## 2021-12-15 ENCOUNTER — Encounter: Payer: Self-pay | Admitting: Emergency Medicine

## 2021-12-15 ENCOUNTER — Other Ambulatory Visit: Payer: Self-pay

## 2021-12-15 ENCOUNTER — Ambulatory Visit
Admission: EM | Admit: 2021-12-15 | Discharge: 2021-12-15 | Disposition: A | Discharge status: Home / Self Care | Payer: Medicaid Other | Attending: Family | Admitting: Family

## 2021-12-15 DIAGNOSIS — R140 Abdominal distension (gaseous): Secondary | ICD-10-CM | POA: Diagnosis present

## 2021-12-15 DIAGNOSIS — R051 Acute cough: Secondary | ICD-10-CM | POA: Diagnosis present

## 2021-12-15 DIAGNOSIS — R3915 Urgency of urination: Secondary | ICD-10-CM

## 2021-12-15 DIAGNOSIS — R300 Dysuria: Secondary | ICD-10-CM | POA: Insufficient documentation

## 2021-12-15 DIAGNOSIS — J0110 Acute frontal sinusitis, unspecified: Secondary | ICD-10-CM | POA: Insufficient documentation

## 2021-12-15 DIAGNOSIS — Z889 Allergy status to unspecified drugs, medicaments and biological substances status: Secondary | ICD-10-CM | POA: Insufficient documentation

## 2021-12-15 LAB — URINALYSIS, ROUTINE W REFLEX MICROSCOPIC
Bilirubin Urine: NEGATIVE
Glucose, UA: NEGATIVE mg/dL
Hgb urine dipstick: NEGATIVE
Ketones, ur: NEGATIVE mg/dL
Leukocytes,Ua: NEGATIVE
Nitrite: NEGATIVE
Protein, ur: NEGATIVE mg/dL
Specific Gravity, Urine: <1.005 — ABNORMAL LOW (ref 1.005–1.030)
pH: 6 (ref 5.0–8.0)

## 2021-12-15 MED ORDER — AMOXICILLIN-POT CLAVULANATE 875-125 MG PO TABS: 1.0000 | ORAL_TABLET | Freq: Two times a day (BID) | ORAL | 0 refills | Status: AC

## 2021-12-15 MED ORDER — FLUCONAZOLE 150 MG PO TABS: 150.0000 mg | ORAL_TABLET | Freq: Once | ORAL | 0 refills | Status: AC

## 2021-12-15 MED ORDER — ALBUTEROL SULFATE HFA 108 (90 BASE) MCG/ACT IN AERS: 2.0000 | INHALATION_SPRAY | Freq: Four times a day (QID) | RESPIRATORY_TRACT | 0 refills | Status: DC | PRN

## 2021-12-15 NOTE — ED Provider Notes (Signed)
[facility]    CSN: [id number] Arrival date & time: 12/15/21  1553      History   Chief Complaint Chief Complaint  Patient presents with   Urinary Tract Infection   Cough    HPI Cheyenne HorsfallSarah E Weeks is a 41 y.o. female.   41 year old female presents with multiple concerns.  First is continued urinary discomfort. Has been having intermittent burning with urination, urinary frequency and urgency for over 1 month. Was seen at her GYN on 11/16/2021 and had unremarkable urinalysis but urine culture was sent. Positive for E.coli and sensitive to Macrobid which patient was placed on at time of visit. She missed a few days of the antibiotic. Also had wet prep and STD testing done which were all negative. Concerned she may have a kidney infection now with lower back pain and feeling weak with chills. Denies any fever, hematuria, or unusual vaginal discharge.  Next concern is persistent cough for over 3 weeks. Has had some nasal congestion, sinus pressure and cough with post nasal drainage. Smokes tobacco daily. No vomiting due to cough. Some nausea, diarrhea and constipation along with abdominal bloating. Has history of possible Irritable bowel syndrome but has not seen a GI specialist.  Last concern is skin irritation, particularly around her left upper inner eyelid. Has history of ?autoimmune disease and has been placed on cream and eye drops in the past and requests medication but uncertain of name.  Other chronic health issues include ADD, environmental allergies, and anxiety. Currently on Vyvanse and Dextrostat daily (Adderall not currently available). Also taking Allegra and using Flonase daily.   The history is provided by the patient.   Past Medical History:  Diagnosis Date   Anxiety    Scoliosis     Patient Active Problem List   Diagnosis Date Noted   Trichomoniasis 11/16/2021    Past Surgical History:  Procedure Laterality Date   CESAREAN SECTION      OB History      Gravida  7   Para  2   Term  2   Preterm      AB  3   Living  2      SAB  3   IAB      Ectopic      Multiple      Live Births  2            Home Medications    Prior to Admission medications   Medication Sig Start Date End Date Taking? Authorizing Provider  albuterol (VENTOLIN HFA) 108 (90 Base) MCG/ACT inhaler Inhale 2 puffs into the lungs every 6 (six) hours as needed for wheezing (or cough). 12/15/21  Yes Gomer France, Ali LoweAnn Berry, NP  amoxicillin-clavulanate (AUGMENTIN) 875-125 MG tablet Take 1 tablet by mouth every 12 (twelve) hours for 7 days. 12/15/21 12/22/21 Yes Shawndrea Rutkowski, Ali LoweAnn Berry, NP  amphetamine-dextroamphetamine (ADDERALL) 20 MG tablet Take 20 mg by mouth 3 (three) times daily. Patient not taking: Reported on 12/15/2021 10/20/21   [provider]  dextroamphetamine (DEXTROSTAT) 10 MG tablet Take 15 mg by mouth daily. 12/07/21   [provider]  LORazepam (ATIVAN) 1 MG tablet Take 1 mg by mouth 2 (two) times daily. 10/20/21   [provider]  VYVANSE 40 MG capsule Take 40 mg by mouth every morning. 12/05/21   [provider]    Family History History reviewed. No pertinent family history.  Social History Social History   Tobacco Use  Smoking status: Every Day    Packs/day: 0.50    Types: Cigarettes   Smokeless tobacco: Never  Vaping Use   Vaping Use: Never used  Substance Use Topics   Alcohol use: Yes    Comment: occasionally   Drug use: Never     Allergies   Patient has no known allergies.   Review of Systems Review of Systems  Constitutional:  Positive for appetite change, chills and fatigue. Negative for diaphoresis and fever.  HENT:  Positive for congestion, postnasal drip and sinus pressure. Negative for ear discharge, ear pain, facial swelling, mouth sores, rhinorrhea and trouble swallowing.   Eyes:  Negative for pain, discharge and redness.  Respiratory:  Positive for cough and chest tightness. Negative for  shortness of breath.   Gastrointestinal:  Positive for abdominal pain, constipation, diarrhea and nausea. Negative for blood in stool and vomiting.  Genitourinary:  Positive for dysuria, flank pain, frequency and urgency. Negative for difficulty urinating, genital sores, hematuria, menstrual problem, vaginal bleeding and vaginal discharge.  Musculoskeletal:  Positive for arthralgias, back pain and myalgias. Negative for neck pain and neck stiffness.  Skin:  Positive for color change and rash.  Allergic/Immunologic: Positive for environmental allergies. Negative for food allergies.  Neurological:  Positive for headaches. Negative for dizziness, tremors, seizures, syncope, speech difficulty and numbness.  Hematological:  Negative for adenopathy. Does not bruise/bleed easily.    Physical Exam Triage Vital Signs ED Triage Vitals  Enc Vitals Group     BP 12/15/21 1618 122/69     Pulse Rate 12/15/21 1618 (!) 104     Resp 12/15/21 1618 18     Temp 12/15/21 1618 98.7 F (37.1 C)     Temp Source 12/15/21 1618 Oral     SpO2 12/15/21 1618 100 %     Weight --      Height --      Head Circumference --      Peak Flow --      Pain Score 12/15/21 1613 7     Pain Loc --      Pain Edu? --      Excl. in GC? --    No data found.  Updated Vital Signs BP 122/69 (BP Location: Right Arm)    Pulse (!) 104    Temp 98.7 F (37.1 C) (Oral)    Resp 18    LMP 11/17/2021    SpO2 100%   Visual Acuity Right Eye Distance:   Left Eye Distance:   Bilateral Distance:    Right Eye Near:   Left Eye Near:    Bilateral Near:     Physical Exam Vitals and nursing note reviewed.  Constitutional:      General: She is awake. She is not in acute distress.    Appearance: She is well-developed and well-groomed.     Comments: She is sitting on the exam table in no acute distress.   HENT:     Head: Normocephalic and atraumatic.     Right Ear: Hearing, tympanic membrane, ear canal and external ear normal.     Left  Ear: Hearing, tympanic membrane, ear canal and external ear normal.     Nose: Congestion present.     Right Sinus: Frontal sinus tenderness present. No maxillary sinus tenderness.     Left Sinus: Frontal sinus tenderness present. No maxillary sinus tenderness.     Mouth/Throat:     Lips: Pink.     Mouth: Mucous membranes are moist.  Pharynx: Uvula midline. Oropharyngeal exudate and posterior oropharyngeal erythema present. No pharyngeal swelling or uvula swelling.     Comments: Yellow thick post nasal drainage present Eyes:     General: Vision grossly intact.     Extraocular Movements: Extraocular movements intact.     Conjunctiva/sclera: Conjunctivae normal.      Comments: Slightly red, macular rash/irritation present on left inner upper eyelid area extending mid upper eyelid. No crusting.  Conjunctiva is not red. No discharge.   Cardiovascular:     Rate and Rhythm: Regular rhythm. Tachycardia present.     Heart sounds: Normal heart sounds. No murmur heard. Pulmonary:     Effort: Pulmonary effort is normal. No accessory muscle usage, respiratory distress or retractions.     Breath sounds: Normal air entry. No decreased air movement. Examination of the right-upper field reveals wheezing and rhonchi. Examination of the left-upper field reveals wheezing and rhonchi. Wheezing and rhonchi present. No decreased breath sounds or rales.     Comments: Slightly coarse breath sounds in upper lobes, mainly with coughing.  Abdominal:     General: Abdomen is flat. Bowel sounds are normal.     Palpations: Abdomen is soft. There is no hepatomegaly or splenomegaly.     Tenderness: There is no abdominal tenderness. There is no right CVA tenderness, left CVA tenderness, guarding or rebound.  Musculoskeletal:        General: Normal range of motion.     Cervical back: Normal range of motion and neck supple.  Lymphadenopathy:     Cervical: No cervical adenopathy.  Skin:    General: Skin is warm and  dry.     Findings: Rash (upper eyelid) present. Rash is macular. Rash is not crusting, papular, pustular or vesicular.  Neurological:     General: No focal deficit present.     Mental Status: She is alert and oriented to person, place, and time.  Psychiatric:        Attention and Perception: Attention normal.        Mood and Affect: Mood is anxious.        Behavior: Behavior is cooperative.        Thought Content: Thought content normal.        Cognition and Memory: Cognition normal.     Comments: Talks quickly and has multiple questions and concerns.      UC Treatments / Results  Labs (all labs ordered are listed, but only abnormal results are displayed) Labs Reviewed  URINALYSIS, ROUTINE W REFLEX MICROSCOPIC - Abnormal; Notable for the following components:      Result Value   Color, Urine STRAW (*)    Specific Gravity, Urine <1.005 (*)    All other components within normal limits  URINE CULTURE    EKG   Radiology No results found.  Procedures Procedures (including critical care time)  Medications Ordered in UC Medications - No data to display  Initial Impression / Assessment and Plan / UC Course  I have reviewed the triage vital signs and the nursing notes.  Pertinent labs & imaging results that were available during my care of the patient were reviewed by me and considered in my medical decision making (see chart for details).     Reviewed urinalysis results with patient-urine is very dilute.  No significant findings but may be due to low specific gravity.  We will send urine for culture.  Discussed that she appears to have a frontal sinus infection with postnasal drainage that is  contributing to her cough.  Recommend start Augmentin 875 mg twice a day as directed.  May take Diflucan 150 mg - 1 pill tomorrow and may repeat 1 pill at end of antibiotic to help prevent antibiotic associated yeast vaginitis.  May restart Albuterol inhaler 2 puffs every 6 hours as needed  for wheezing or cough.  Continue Allegra and Flonase daily.  Continue to push fluids to help loosen up mucus in sinuses and chest. Briefly discussed rash/irritation on left eyelid-uncertain what medication she was previously prescribed-will need to see her PCP for evaluation and possible referral to dermatology or ophthalmology. Also briefly discussed she will need to see her PCP for referral to GI specialist for further evaluation and management of possible irritable bowel syndrome. Recommend follow-up pending urine culture results and with PCP as discussed.  Final Clinical Impressions(s) / UC Diagnoses   Final diagnoses:  Dysuria  Urinary urgency  Acute non-recurrent frontal sinusitis  Acute cough  History of seasonal allergies  Abdominal bloating     Discharge Instructions      Recommend start Augmentin  twice a day as directed. May take Diflucan  tablet- take 1 tablet tomorrow and then 1 tablet at end of antibiotic use to help prevent antibiotic-caused yeast infections. May use Albuterol inhaler 2 puffs every 6 hours as needed for cough. Continue Allegra and Flonase daily. Recommend follow-up with your PCP for any referral to [facility] for eye issues and to GI specialist for continued evaluation of irritable bowel. Also follow-up pending urine culture results.     ED Prescriptions     Medication Sig Dispense Auth. Provider   amoxicillin-clavulanate (AUGMENTIN) 875-125 MG tablet Take 1 tablet by mouth every 12 (twelve) hours for 7 days. 14 tablet Sudie Grumbling, NP   fluconazole (DIFLUCAN) 150 MG tablet Take 1 tablet (150 mg total) by mouth once for 1 dose. May repeat 1 tablet at end of antibiotic use. 2 tablet Sudie Grumbling, NP   albuterol (VENTOLIN HFA) 108 (90 Base) MCG/ACT inhaler Inhale 2 puffs into the lungs every 6 (six) hours as needed for wheezing (or cough). 18 g Sudie Grumbling, NP      PDMP not reviewed this encounter.   Sudie Grumbling,  NP 12/16/21 1230

## 2021-12-15 NOTE — Discharge Instructions (Addendum)
Recommend start Augmentin 875mg  twice a day as directed. May take Diflucan 150mg  tablet- take 1 tablet tomorrow and then 1 tablet at end of antibiotic use to help prevent antibiotic-caused yeast infections. May use Albuterol inhaler 2 puffs every 6 hours as needed for cough. Continue Allegra and Flonase daily. Recommend follow-up with your PCP for any referral to [facility] for eye issues and to GI specialist for continued evaluation of irritable bowel. Also follow-up pending urine culture results.  ?

## 2021-12-15 NOTE — ED Triage Notes (Signed)
Ob/gyn sent a urine culture and diagnosed patient with uti and did take antibiotic.  This was 3-4 weeks ago.   ?Patient is concerned uti is not resoled.  Burning with urination,left lower back pain, has noticed urgency at times.   ? ?Patient has a cough for 3 weeks, coughing up phlegm.  Reports congestion in chest ?

## 2021-12-17 LAB — URINE CULTURE: Culture: NO GROWTH

## 2022-01-17 ENCOUNTER — Other Ambulatory Visit: Payer: Self-pay | Admitting: Nurse Practitioner

## 2022-09-19 ENCOUNTER — Telehealth: Payer: Medicaid Other | Admitting: Physician Assistant

## 2022-09-19 DIAGNOSIS — R21 Rash and other nonspecific skin eruption: Secondary | ICD-10-CM

## 2022-09-19 MED ORDER — TRIAMCINOLONE ACETONIDE 0.1 % EX CREA: 1.0000 | TOPICAL_CREAM | Freq: Two times a day (BID) | CUTANEOUS | 0 refills | Status: DC

## 2022-09-19 NOTE — Progress Notes (Signed)
E Visit for Rash  We are sorry that you are not feeling well. Here is how we plan to help!  Based on what you shared with me it looks like you have contact dermatitis.  Contact dermatitis is a skin rash caused by something that touches the skin and causes irritation or inflammation.  Your skin may be red, swollen, dry, cracked, and itch.  The rash should go away in a few days but can last a few weeks.  If you get a rash, it's important to figure out what caused it so the irritant can be avoided in the future.  I have prescribed Triamcinolone cream to apply to affected areas 2-3 times daily for 7-14 days.   HOME CARE:  Take cool showers and avoid direct sunlight. Apply cool compress or wet dressings. Take a bath in an oatmeal bath.  Sprinkle content of one Aveeno packet under running faucet with comfortably warm water.  Bathe for 15-20 minutes, 1-2 times daily.  Pat dry with a towel. Do not rub the rash. Use hydrocortisone cream. Take an antihistamine like Benadryl for widespread rashes that itch.  The adult dose of Benadryl is 25-50 mg by mouth 4 times daily. Caution:  This type of medication may cause sleepiness.  Do not drink alcohol, drive, or operate dangerous machinery while taking antihistamines.  Do not take these medications if you have prostate enlargement.  Read package instructions thoroughly on all medications that you take.  GET HELP RIGHT AWAY IF:  Symptoms don't go away after treatment. Severe itching that persists. If you rash spreads or swells. If you rash begins to smell. If it blisters and opens or develops a yellow-brown crust. You develop a fever. You have a sore throat. You become short of breath.  MAKE SURE YOU:  Understand these instructions. Will watch your condition. Will get help right away if you are not doing well or get worse.  Thank you for choosing an e-visit.  Your e-visit answers were reviewed by a board certified advanced clinical practitioner  to complete your personal care plan. Depending upon the condition, your plan could have included both over the counter or prescription medications.  Please review your pharmacy choice. Make sure the pharmacy is open so you can pick up prescription now. If there is a problem, you may contact your provider through [employer] and have the prescription routed to another pharmacy.  Your safety is important to [place]. If you have drug allergies check your prescription carefully.   For the next 24 hours you can use MyChart to ask questions about today's visit, request a non-urgent call back, or ask for a work or school excuse. You will get an email in the next two days asking about your experience. I hope that your e-visit has been valuable and will speed your recovery.  I have spent 5 minutes in review of e-visit questionnaire, review and updating patient chart, medical decision making and response to patient.   Margaretann Loveless, PA-C

## 2022-11-28 ENCOUNTER — Telehealth: Payer: Medicaid Other | Admitting: Physician Assistant

## 2022-11-28 DIAGNOSIS — J208 Acute bronchitis due to other specified organisms: Secondary | ICD-10-CM | POA: Diagnosis not present

## 2022-11-28 MED ORDER — PREDNISONE 10 MG (21) PO TBPK: ORAL_TABLET | ORAL | 0 refills | Status: DC

## 2022-11-28 MED ORDER — BENZONATATE 100 MG PO CAPS: 100.0000 mg | ORAL_CAPSULE | Freq: Three times a day (TID) | ORAL | 0 refills | Status: DC | PRN

## 2022-11-28 MED ORDER — VENTOLIN HFA 108 (90 BASE) MCG/ACT IN AERS: 1.0000 | INHALATION_SPRAY | Freq: Four times a day (QID) | RESPIRATORY_TRACT | 0 refills | Status: DC | PRN

## 2022-11-28 MED ORDER — ALBUTEROL SULFATE HFA 108 (90 BASE) MCG/ACT IN AERS: 1.0000 | INHALATION_SPRAY | Freq: Four times a day (QID) | RESPIRATORY_TRACT | 0 refills | Status: DC | PRN

## 2022-11-28 NOTE — Addendum Note (Signed)
Addended by: Mar Daring on: 11/28/2022 04:43 PM   Modules accepted: Orders

## 2022-11-28 NOTE — Progress Notes (Signed)
We are sorry that you are not feeling well.  Here is how we plan to help!  Based on your presentation I believe you most likely have A cough due to a virus.  This is called viral bronchitis and is best treated by rest, plenty of fluids and control of the cough.  You may use Ibuprofen or Tylenol as directed to help your symptoms.     In addition you may use A non-prescription cough medication called Mucinex DM: take 2 tablets every 12 hours. and A prescription cough medication called Tessalon Perles 149m. You may take 1-2 capsules every 8 hours as needed for your cough.  Prednisone 10 mg daily for 6 days (see taper instructions below)  Directions for 6 day taper: Day 1: 2 tablets before breakfast, 1 after both lunch & dinner and 2 at bedtime Day 2: 1 tab before breakfast, 1 after both lunch & dinner and 2 at bedtime Day 3: 1 tab at each meal & 1 at bedtime Day 4: 1 tab at breakfast, 1 at lunch, 1 at bedtime Day 5: 1 tab at breakfast & 1 tab at bedtime Day 6: 1 tab at breakfast  Albuterol inhaler 1-2 puffs every 4-6 hours as needed for shortness of breath or wheezing.  From your responses in the eVisit questionnaire you describe inflammation in the upper respiratory tract which is causing a significant cough.  This is commonly called Bronchitis and has four common causes:   Allergies Viral Infections Acid Reflux Bacterial Infection Allergies, viruses and acid reflux are treated by controlling symptoms or eliminating the cause. An example might be a cough caused by taking certain blood pressure medications. You stop the cough by changing the medication. Another example might be a cough caused by acid reflux. Controlling the reflux helps control the cough.  USE OF BRONCHODILATOR ("RESCUE") INHALERS: There is a risk from using your bronchodilator too frequently.  The risk is that over-reliance on a medication which only relaxes the muscles surrounding the breathing tubes can reduce the  effectiveness of medications prescribed to reduce swelling and congestion of the tubes themselves.  Although you feel brief relief from the bronchodilator inhaler, your asthma may actually be worsening with the tubes becoming more swollen and filled with mucus.  This can delay other crucial treatments, such as oral steroid medications. If you need to use a bronchodilator inhaler daily, several times per day, you should discuss this with your provider.  There are probably better treatments that could be used to keep your asthma under control.     HOME CARE Only take medications as instructed by your medical team. Complete the entire course of an antibiotic. Drink plenty of fluids and get plenty of rest. Avoid close contacts especially the very young and the elderly Cover your mouth if you cough or cough into your sleeve. Always remember to wash your hands A steam or ultrasonic humidifier can help congestion.   GET HELP RIGHT AWAY IF: You develop worsening fever. You become short of breath You cough up blood. Your symptoms persist after you have completed your treatment plan MAKE SURE YOU  Understand these instructions. Will watch your condition. Will get help right away if you are not doing well or get worse.    Thank you for choosing an e-visit.  Your e-visit answers were reviewed by a board certified advanced clinical practitioner to complete your personal care plan. Depending upon the condition, your plan could have included both over the counter or prescription  medications.  Please review your pharmacy choice. Make sure the pharmacy is open so you can pick up prescription now. If there is a problem, you may contact your provider through [employer] and have the prescription routed to another pharmacy.  Your safety is important to [place]. If you have drug allergies check your prescription carefully.   For the next 24 hours you can use MyChart to ask questions about today's visit,  request a non-urgent call back, or ask for a work or school excuse. You will get an email in the next two days asking about your experience. I hope that your e-visit has been valuable and will speed your recovery.  I have spent 5 minutes in review of e-visit questionnaire, review and updating patient chart, medical decision making and response to patient.   Mar Daring, PA-C

## 2022-12-01 ENCOUNTER — Telehealth: Payer: Medicaid Other | Admitting: Physician Assistant

## 2022-12-01 DIAGNOSIS — B9689 Other specified bacterial agents as the cause of diseases classified elsewhere: Secondary | ICD-10-CM

## 2022-12-01 MED ORDER — AZITHROMYCIN 250 MG PO TABS: ORAL_TABLET | ORAL | 0 refills | Status: AC

## 2022-12-01 NOTE — Progress Notes (Signed)
We are sorry that you are not feeling well.  Here is how we plan to help!  Based on your presentation I believe you most likely have A cough due to bacteria.  When patients have a fever and a productive cough with a change in color or increased sputum production, we are concerned about bacterial bronchitis.  If left untreated it can progress to pneumonia.  If your symptoms do not improve with your treatment plan it is important that you contact your provider.   I have prescribed Azithromyin 250 mg: two tablets now and then one tablet daily for 4 additonal days    In addition you may use A non-prescription cough medication called Mucinex DM: take 2 tablets every 12 hours. and A prescription cough medication called Tessalon Perles '100mg'$ . You may take 1-2 capsules every 8 hours as needed for your cough.   From your responses in the eVisit questionnaire you describe inflammation in the upper respiratory tract which is causing a significant cough.  This is commonly called Bronchitis and has four common causes:   Allergies Viral Infections Acid Reflux Bacterial Infection Allergies, viruses and acid reflux are treated by controlling symptoms or eliminating the cause. An example might be a cough caused by taking certain blood pressure medications. You stop the cough by changing the medication. Another example might be a cough caused by acid reflux. Controlling the reflux helps control the cough.  USE OF BRONCHODILATOR ("RESCUE") INHALERS: There is a risk from using your bronchodilator too frequently.  The risk is that over-reliance on a medication which only relaxes the muscles surrounding the breathing tubes can reduce the effectiveness of medications prescribed to reduce swelling and congestion of the tubes themselves.  Although you feel brief relief from the bronchodilator inhaler, your asthma may actually be worsening with the tubes becoming more swollen and filled with mucus.  This can delay other  crucial treatments, such as oral steroid medications. If you need to use a bronchodilator inhaler daily, several times per day, you should discuss this with your provider.  There are probably better treatments that could be used to keep your asthma under control.     HOME CARE Only take medications as instructed by your medical team. Complete the entire course of an antibiotic. Drink plenty of fluids and get plenty of rest. Avoid close contacts especially the very young and the elderly Cover your mouth if you cough or cough into your sleeve. Always remember to wash your hands A steam or ultrasonic humidifier can help congestion.   GET HELP RIGHT AWAY IF: You develop worsening fever. You become short of breath You cough up blood. Your symptoms persist after you have completed your treatment plan MAKE SURE YOU  Understand these instructions. Will watch your condition. Will get help right away if you are not doing well or get worse.    Thank you for choosing an e-visit.  Your e-visit answers were reviewed by a board certified advanced clinical practitioner to complete your personal care plan. Depending upon the condition, your plan could have included both over the counter or prescription medications.  Please review your pharmacy choice. Make sure the pharmacy is open so you can pick up prescription now. If there is a problem, you may contact your provider through [employer] and have the prescription routed to another pharmacy.  Your safety is important to [place]. If you have drug allergies check your prescription carefully.   For the next 24 hours you can use  MyChart to ask questions about today's visit, request a non-urgent call back, or ask for a work or school excuse. You will get an email in the next two days asking about your experience. I hope that your e-visit has been valuable and will speed your recovery.  I have spent 5 minutes in review of e-visit questionnaire, review and  updating patient chart, medical decision making and response to patient.   Mar Daring, PA-C

## 2023-01-17 ENCOUNTER — Telehealth: Payer: Medicaid Other | Admitting: Nurse Practitioner

## 2023-01-17 DIAGNOSIS — K047 Periapical abscess without sinus: Secondary | ICD-10-CM | POA: Diagnosis not present

## 2023-01-17 MED ORDER — PENICILLIN V POTASSIUM 500 MG PO TABS: 500.0000 mg | ORAL_TABLET | Freq: Three times a day (TID) | ORAL | 0 refills | Status: AC

## 2023-01-17 NOTE — Progress Notes (Signed)
E-Visit for Dental Pain  We are sorry that you are not feeling well.  Here is how we plan to help!  Based on what you have shared with me in the questionnaire, it sounds like you have an infection around your tooth  Pen VK 500mg  3 times a day for 7 days  It is imperative that you see a dentist within 10 days of this eVisit to determine the cause of the dental pain and be sure it is adequately treated  A toothache or tooth pain is caused when the nerve in the root of a tooth or surrounding a tooth is irritated. Dental (tooth) infection, decay, injury, or loss of a tooth are the most common causes of dental pain. Pain may also occur after an extraction (tooth is pulled out). Pain sometimes originates from other areas and radiates to the jaw, thus appearing to be tooth pain.Bacteria growing inside your mouth can contribute to gum disease and dental decay, both of which can cause pain. A toothache occurs from inflammation of the central portion of the tooth called pulp. The pulp contains nerve endings that are very sensitive to pain. Inflammation to the pulp or pulpitis may be caused by dental cavities, trauma, and infection.    HOME CARE:   For toothaches: Over-the-counter pain medications such as acetaminophen or ibuprofen may be used. Take these as directed on the package while you arrange for a dental appointment. Avoid very cold or hot foods, because they may make the pain worse. You may get relief from biting on a cotton ball soaked in oil of cloves. You can get oil of cloves at most drug stores.  For jaw pain:  Aspirin may be helpful for problems in the joint of the jaw in adults. If pain happens every time you open your mouth widely, the temporomandibular joint (TMJ) may be the source of the pain. Yawning or taking a large bite of food may worsen the pain. An appointment with your doctor or dentist will help you find the cause.     GET HELP RIGHT AWAY IF:  You have a high fever or  chills If you have had a recent head or face injury and develop headache, light headedness, nausea, vomiting, or other symptoms that concern you after an injury to your face or mouth, you could have a more serious injury in addition to your dental injury. A facial rash associated with a toothache: This condition may improve with medication. Contact your doctor for them to decide what is appropriate. Any jaw pain occurring with chest pain: Although jaw pain is most commonly caused by dental disease, it is sometimes referred pain from other areas. People with heart disease, especially people who have had stents placed, people with diabetes, or those who have had heart surgery may have jaw pain as a symptom of heart attack or angina. If your jaw or tooth pain is associated with lightheadedness, sweating, or shortness of breath, you should see a doctor as soon as possible. Trouble swallowing or excessive pain or bleeding from gums: If you have a history of a weakened immune system, diabetes, or steroid use, you may be more susceptible to infections. Infections can often be more severe and extensive or caused by unusual organisms. Dental and gum infections in people with these conditions may require more aggressive treatment. An abscess may need draining or IV antibiotics, for example.  MAKE SURE YOU   Understand these instructions. Will watch your condition. Will get help right  away if you are not doing well or get worse.  Thank you for choosing an e-visit.  Your e-visit answers were reviewed by a board certified advanced clinical practitioner to complete your personal care plan. Depending upon the condition, your plan could have included both over the counter or prescription medications.  Please review your pharmacy choice. Make sure the pharmacy is open so you can pick up prescription now. If there is a problem, you may contact your provider through [employer] and have the prescription routed to  another pharmacy.  Your safety is important to [place]. If you have drug allergies check your prescription carefully.   For the next 24 hours you can use MyChart to ask questions about today's visit, request a non-urgent call back, or ask for a work or school excuse. You will get an email in the next two days asking about your experience. I hope that your e-visit has been valuable and will speed your recovery.   Meds ordered this encounter  Medications   penicillin v potassium (VEETID) 500 MG tablet    Sig: Take 1 tablet (500 mg total) by mouth 3 (three) times daily for 7 days.    Dispense:  21 tablet    Refill:  0    I spent approximately 5 minutes reviewing the patient's history, current symptoms and coordinating their care today.

## 2023-01-21 ENCOUNTER — Telehealth: Payer: Medicaid Other | Admitting: Family

## 2023-01-21 DIAGNOSIS — B3731 Acute candidiasis of vulva and vagina: Secondary | ICD-10-CM

## 2023-01-21 NOTE — Progress Notes (Signed)

## 2023-01-24 MED ORDER — FLUCONAZOLE 150 MG PO TABS: 150.0000 mg | ORAL_TABLET | Freq: Once | ORAL | 0 refills | Status: AC

## 2023-01-24 NOTE — Addendum Note (Signed)
Addended by: Viviano Simas E on: 01/24/2023 05:11 PM   Modules accepted: Orders

## 2023-02-26 ENCOUNTER — Ambulatory Visit: Payer: Medicaid Other | Admitting: Obstetrics

## 2023-03-09 ENCOUNTER — Ambulatory Visit: Payer: Medicaid Other | Admitting: Certified Nurse Midwife

## 2023-03-22 ENCOUNTER — Ambulatory Visit: Payer: Medicaid Other | Admitting: Nurse Practitioner

## 2023-04-06 ENCOUNTER — Telehealth: Payer: Medicaid Other | Admitting: Physician Assistant

## 2023-04-06 DIAGNOSIS — T7840XA Allergy, unspecified, initial encounter: Secondary | ICD-10-CM | POA: Diagnosis not present

## 2023-04-06 DIAGNOSIS — K649 Unspecified hemorrhoids: Secondary | ICD-10-CM

## 2023-04-06 DIAGNOSIS — R21 Rash and other nonspecific skin eruption: Secondary | ICD-10-CM

## 2023-04-07 MED ORDER — HYDROCORTISONE (PERIANAL) 2.5 % EX CREA: 1.0000 | TOPICAL_CREAM | Freq: Two times a day (BID) | CUTANEOUS | 0 refills | Status: DC

## 2023-04-07 MED ORDER — TRIAMCINOLONE ACETONIDE 0.1 % EX CREA: 1.0000 | TOPICAL_CREAM | Freq: Two times a day (BID) | CUTANEOUS | 0 refills | Status: DC

## 2023-04-07 MED ORDER — FLUTICASONE PROPIONATE 50 MCG/ACT NA SUSP: 2.0000 | Freq: Every day | NASAL | 6 refills | Status: AC

## 2023-04-07 MED ORDER — PSEUDOEPHEDRINE HCL 30 MG PO TABS: 30.0000 mg | ORAL_TABLET | ORAL | 0 refills | Status: DC | PRN

## 2023-04-07 NOTE — Progress Notes (Signed)
E Visit for Rash  We are sorry that you are not feeling well. Here is how we plan to help!  Based on what you have shared your rash may be eyelid dermatitis.  I have prescribed triamcinolone that you can use twice a day for up to 2 weeks.  If no improvement please follow up with your primary care physician for in person evaluation.        HOME CARE:  Take cool showers and avoid direct sunlight. Apply cool compress or wet dressings. Take a bath in an oatmeal bath.  Sprinkle content of one Aveeno packet under running faucet with comfortably warm water.  Bathe for 15-20 minutes, 1-2 times daily.  Pat dry with a towel. Do not rub the rash. Use hydrocortisone cream. Take an antihistamine like Benadryl for widespread rashes that itch.  The adult dose of Benadryl is 25-50 mg by mouth 4 times daily. Caution:  This type of medication may cause sleepiness.  Do not drink alcohol, drive, or operate dangerous machinery while taking antihistamines.  Do not take these medications if you have prostate enlargement.  Read package instructions thoroughly on all medications that you take.  GET HELP RIGHT AWAY IF:  Symptoms don't go away after treatment. Severe itching that persists. If you rash spreads or swells. If you rash begins to smell. If it blisters and opens or develops a yellow-brown crust. You develop a fever. You have a sore throat. You become short of breath.  MAKE SURE YOU:  Understand these instructions. Will watch your condition. Will get help right away if you are not doing well or get worse.  Thank you for choosing an e-visit.  Your e-visit answers were reviewed by a board certified advanced clinical practitioner to complete your personal care plan. Depending upon the condition, your plan could have included both over the counter or prescription medications.  Please review your pharmacy choice. Make sure the pharmacy is open so you can pick up prescription now. If there is a  problem, you may contact your provider through [employer] and have the prescription routed to another pharmacy.  Your safety is important to [place]. If you have drug allergies check your prescription carefully.   For the next 24 hours you can use MyChart to ask questions about today's visit, request a non-urgent call back, or ask for a work or school excuse. You will get an email in the next two days asking about your experience. I hope that your e-visit has been valuable and will speed your recovery.   I have spent 5 minutes in review of e-visit questionnaire, review and updating patient chart, medical decision making and response to patient.   Second evisit requesting Flonase and Sudafed due to allergies.  Medications sent to pharmacy.   Tylene Fantasia Ward, PA-C

## 2023-04-07 NOTE — Progress Notes (Signed)
E-Visit for Hemorrhoid  We are sorry that you are not feeling well. We are here to help!  Hemorrhoids are swollen veins in the rectum. They can cause itching, bleeding, and pain. Hemorrhoids are very common.  In some cases, you can see or feel hemorrhoids around the outside of the rectum. In other cases, you cannot see them because they are hidden inside the rectum. Be patient - It can take months for this to improve or go away.   Hemorrhoids do not always cause symptoms. But when they do, symptoms can include: ?Itching of the skin around the anus ?Bleeding - Bleeding is usually painless. You might see bright red blood after using the toilet. ?Pain - If a blood clot forms inside a hemorrhoid, this can cause pain. It can also cause a lump that you might be able to feel.   What can I do to keep from getting more hemorrhoids? -- The most important thing you can do is to keep from getting constipated. You should have a bowel movement at least a few times a week. When you have a bowel movement, you also should not have to push too much. Plus, your bowel movements should not be too hard. Being constipated and having hard bowel movements can make hemorrhoids worse.   I have prescribed Topical Hydrocortisone ointment 2.5%.  Apply to area two times per day for 30 days  HOME CARE: Sitz Baths twice daily. Soak buttocks in 2 or 3 inches of warm water for 10 to 15 minutes. Do not add soap, bubble bath, or anything to the water. Stool softener such as Colace 100 mg twice daily AND Miralax 1 scoop daily until you have regular soft stools Over the counter Preparation H Tucks Pads Witch Hazel  Here are some steps you can take to avoid getting constipated or having hard stools:  ?Eat lots of fruits, vegetables, and other foods with fiber. Fiber helps to increase bowel movements. If you do not get enough fiber from your diet, you can take fiber supplements. These come in the form of powders, wafers, or  pills. Some examples are Metamucil, Citrucel, Benefiber and FiberCon. If you take a fiber supplement, be sure to read the label so you know how much to take. If you're not sure, ask your provider or nurse. ?Take medicines called "stool softeners" such as docusate sodium (sample brand names: Colace, Dulcolax). These medicines increase the number of bowel movements you have. They are safe to take and they can prevent problems later.  You should request a referral for a follow up evaluation with a Gastroenterologist (GI doctor) to evaluate this chronic and relapsing condition - even if it improves to see what further steps need to be taken. This is highly linked to chronic constipation and straining to have a bowel movement. It may require further treatment or surgical intervention.   GET HELP RIGHT AWAY IF: You develop severe pain You have heavy bleeding   FOLLOW UP WITH YOUR PRIMARY PROVIDER IF: If your symptoms do not improve within 10 days  MAKE SURE YOU  Understand these instructions. Will watch your condition. Will get help right away if you are not doing well or get worse.   Thank you for choosing an e-visit.  Your e-visit answers were reviewed by a board certified advanced clinical practitioner to complete your personal care plan. Depending upon the condition, your plan could have included both over the counter or prescription medications.  Please review your pharmacy choice. Make   sure the pharmacy is open so you can pick up prescription now. If there is a problem, you may contact your provider through MyChart messaging and have the prescription routed to another pharmacy.  Your safety is important to us. If you have drug allergies check your prescription carefully.   For the next 24 hours you can use MyChart to ask questions about today's visit, request a non-urgent call back, or ask for a work or school excuse. You will get an email in the next two days asking about your experience.  I hope that your e-visit has been valuable and will speed your recovery.   I have spent 5 minutes in review of e-visit questionnaire, review and updating patient chart, medical decision making and response to patient.   Greenlee Ancheta Z Ward, PA-C    

## 2023-04-07 NOTE — Progress Notes (Signed)
See previous e-visit notes

## 2023-04-09 ENCOUNTER — Encounter: Payer: Medicaid Other | Admitting: Nurse Practitioner

## 2023-04-16 ENCOUNTER — Telehealth: Payer: Medicaid Other | Admitting: Physician Assistant

## 2023-04-16 DIAGNOSIS — Z202 Contact with and (suspected) exposure to infections with a predominantly sexual mode of transmission: Secondary | ICD-10-CM

## 2023-04-16 NOTE — Addendum Note (Signed)
Addended by: Shirleyann Montero M on: 04/16/2023 07:41 AM   Modules accepted: Level of Service  

## 2023-04-16 NOTE — Progress Notes (Signed)
Because of possible STD exposure, I feel your condition warrants further evaluation and I recommend that you be seen in a face to face visit for official testing.   NOTE: There will be NO CHARGE for this eVisit   If you are having a true medical emergency please call 911.      For an urgent face to face visit, [facility] has eight urgent care centers for your convenience:   NEW!! [facility] Health Urgent Care Center at [facility] Get Driving Directions [phone] [address] [place], [address]    [facility] Health Urgent Care Center at [facility] Get Driving Directions [phone] [address] [place], [place] [address]   [facility] Health Urgent Care Center [facility]) Get Driving Directions [phone] [address] [place], [place] [address]  [facility] Health Urgent Care Center [facility]) Get Driving Directions [phone] [address] [place],  [place]  [address]  [facility] Health Urgent Care Center [facility] - at [employer]  [phone] [address] [place],  [place] [address]   [facility] Health Urgent Care at [facility] Get Driving Directions [phone] [address] [place], [place] [address]   [facility] Health Urgent Care at [facility] Get Driving Directions  [phone] [address] [place], [place] [address]   [facility] Health Urgent Care at [facility] Directions [phone] [address] [place], [place] [address]  Your MyChart E-visit questionnaire answers were reviewed by a board certified advanced clinical practitioner to complete your personal care plan based on your specific symptoms.  Thank you for using e-Visits.   I have spent 5 minutes in review of e-visit questionnaire, review and updating patient chart, medical decision making and response to patient.   Margaretann Loveless, PA-C

## 2023-04-18 ENCOUNTER — Telehealth: Payer: Medicaid Other | Admitting: Nurse Practitioner

## 2023-04-18 DIAGNOSIS — N898 Other specified noninflammatory disorders of vagina: Secondary | ICD-10-CM

## 2023-04-18 NOTE — Progress Notes (Signed)
Because of your visit concerns yesterday we continue to recommend an in person evaluation for testing.   I feel your condition warrants further evaluation and I recommend that you be seen in a face to face visit.   NOTE: There will be NO CHARGE for this eVisit   If you are having a true medical emergency please call 911.      For an urgent face to face visit, [facility] has eight urgent care centers for your convenience:   NEW!! [facility] Health Urgent Care Center at [facility] Get Driving Directions [phone] [address] [place], [address]    [facility] Health Urgent Care Center at [facility] Get Driving Directions [phone] [address] [place], [place] [address]   [facility] Health Urgent Care Center [facility]) Get Driving Directions [phone] [address] [place], [place] [address]  [facility] Health Urgent Care Center [facility]) Get Driving Directions [phone] [address] [place],  [place]  [address]  [facility] Health Urgent Care Center [facility] - at [employer]  [phone] [address] [place],  [place] [address]   [facility] Health Urgent Care at [facility] Get Driving Directions [phone] [address] [place], [place] [address]   [facility] Health Urgent Care at [facility] Get Driving Directions  [phone] [address] [place], [place] [address]   [facility] Health Urgent Care at [facility] Directions [phone] [address] [place], [place] [address]  Your MyChart E-visit questionnaire answers were reviewed by a board certified advanced clinical practitioner to complete your personal care plan based on your specific symptoms.  Thank you for using e-Visits.

## 2023-04-25 ENCOUNTER — Ambulatory Visit (INDEPENDENT_AMBULATORY_CARE_PROVIDER_SITE_OTHER): Payer: Medicaid Other | Admitting: Certified Nurse Midwife

## 2023-04-25 ENCOUNTER — Other Ambulatory Visit (HOSPITAL_COMMUNITY)
Admission: RE | Admit: 2023-04-25 | Discharge: 2023-04-25 | Disposition: A | Discharge status: Home / Self Care | Payer: Medicaid Other | Source: Ambulatory Visit | Attending: Obstetrics | Admitting: Obstetrics

## 2023-04-25 ENCOUNTER — Encounter: Payer: Self-pay | Admitting: Certified Nurse Midwife

## 2023-04-25 VITALS — BP 123/80 | HR 85 | Ht 65.0 in | Wt 134.9 lb

## 2023-04-25 DIAGNOSIS — Z3202 Encounter for pregnancy test, result negative: Secondary | ICD-10-CM | POA: Diagnosis not present

## 2023-04-25 DIAGNOSIS — R1032 Left lower quadrant pain: Secondary | ICD-10-CM

## 2023-04-25 DIAGNOSIS — R102 Pelvic and perineal pain: Secondary | ICD-10-CM | POA: Diagnosis not present

## 2023-04-25 DIAGNOSIS — N912 Amenorrhea, unspecified: Secondary | ICD-10-CM | POA: Diagnosis not present

## 2023-04-25 DIAGNOSIS — N898 Other specified noninflammatory disorders of vagina: Secondary | ICD-10-CM | POA: Diagnosis not present

## 2023-04-25 LAB — POCT URINE PREGNANCY: Preg Test, Ur: NEGATIVE

## 2023-04-25 MED ORDER — NORETHINDRONE 0.35 MG PO TABS: 1.0000 | ORAL_TABLET | Freq: Every day | ORAL | 3 refills | Status: DC

## 2023-04-25 NOTE — Progress Notes (Signed)
GYN ENCOUNTER NOTE  Subjective:       Cheyenne Weeks is a 42 y.o. [phone] female is here for gynecologic evaluation of the following issues:  1. Lost tampon- has odor and discomfort , has had that happen in past.  2. STD testing - vaginal discharge with odor. 3. Pregnancy testing last period June   Gynecologic History Patient's last menstrual period was 03/29/2023 (approximate). Contraception: none Last Pap: 03/28/2021. Results were: normal   Obstetric History OB History  Gravida Para Term Preterm AB Living  7 2 2   3 2   SAB IAB Ectopic Multiple Live Births  3       2    # Outcome Date GA Lbr Len/2nd Weight Sex Type Anes PTL Lv  7 Term 07/06/13    M Vag-Spont   LIV  6 Term 12/22/11    F CS-Unspec   LIV     Complications: Pre-eclampsia  5 Gravida           4 Gravida           3 SAB           2 SAB           1 SAB             Past Medical History:  Diagnosis Date   Anxiety    Scoliosis     Past Surgical History:  Procedure Laterality Date   CESAREAN SECTION      Current Outpatient Medications on File Prior to Visit  Medication Sig Dispense Refill   amphetamine-dextroamphetamine (ADDERALL) 20 MG tablet Take 20 mg by mouth 3 (three) times daily.     dextroamphetamine (DEXTROSTAT) 10 MG tablet Take 15 mg by mouth daily.     fluticasone (FLONASE) 50 MCG/ACT nasal spray Place 2 sprays into both nostrils daily. 16 g 6   hydrocortisone (ANUSOL-HC) 2.5 % rectal cream Place 1 Application rectally 2 (two) times daily. 30 g 0   LORazepam (ATIVAN) 1 MG tablet Take 1 mg by mouth 2 (two) times daily.     triamcinolone cream (KENALOG) 0.1 % Apply 1 Application topically 2 (two) times daily. 30 g 0   VENTOLIN HFA 108 (90 Base) MCG/ACT inhaler Inhale 1-2 puffs into the lungs every 6 (six) hours as needed for wheezing or shortness of breath. Ventolin brand required based off formulary 6.7 g 0   VYVANSE 40 MG capsule Take 40 mg by mouth every morning.     benzonatate (TESSALON) 100 MG  capsule Take 1 capsule (100 mg total) by mouth 3 (three) times daily as needed. 30 capsule 0   predniSONE (STERAPRED UNI-PAK 21 TAB) 10 MG (21) TBPK tablet 6 day taper; take as directed on package instructions (Patient not taking: Reported on 04/25/2023) 21 tablet 0   pseudoephedrine (SUDAFED) 30 MG tablet Take 1 tablet (30 mg total) by mouth every 4 (four) hours as needed for congestion. (Patient not taking: Reported on 04/25/2023) 30 tablet 0   No current facility-administered medications on file prior to visit.    No Known Allergies  Social History   Socioeconomic History   Marital status: Single    Spouse name: Not on file   Number of children: Not on file   Years of education: Not on file   Highest education level: Not on file  Occupational History   Not on file  Tobacco Use   Smoking status: Every Day    Current packs/day: 0.50  Types: Cigarettes   Smokeless tobacco: Never  Vaping Use   Vaping status: Never Used  Substance and Sexual Activity   Alcohol use: Yes    Comment: occasionally   Drug use: Never   Sexual activity: Yes    Birth control/protection: None  Other Topics Concern   Not on file  Social History Narrative   Not on file   Social Determinants of Health   Financial Resource Strain: Not on file  Food Insecurity: Not on file  Transportation Needs: Not on file  Physical Activity: Not on file  Stress: Not on file  Social Connections: Not on file  Intimate Partner Violence: Not on file    No family history on file.  The following portions of the patient's history were reviewed and updated as appropriate: allergies, current medications, past family history, past medical history, past social history, past surgical history and problem list.  Review of Systems Review of Systems - Negative except As mentioned in HPI Review of Systems - General ROS: negative for - chills, fatigue, fever, hot flashes, malaise or night sweats Hematological and Lymphatic  ROS: negative for - bleeding problems or swollen lymph nodes Gastrointestinal ROS: negative for - abdominal pain, blood in stools, change in bowel habits and nausea/vomiting Musculoskeletal ROS: negative for - joint pain, muscle pain or muscular weakness Genito-Urinary ROS: negative for - change in menstrual cycle, dysmenorrhea, dyspareunia, dysuria, genital discharge, genital ulcers, hematuria, incontinence,heavy menses, nocturia . Positive for  left sided pelvic pain and irregular menses.   Objective:   BP 123/80   Pulse 85   Ht 5\' 5"  (1.651 m)   Wt 134 lb 14.4 oz (61.2 kg)   LMP 03/29/2023 (Approximate)   BMI 22.45 kg/m  CONSTITUTIONAL: Well-developed, well-nourished female in no acute distress.  HENT:  Normocephalic, atraumatic.  NECK: Normal range of motion, supple, no masses.  Normal thyroid.  SKIN: Skin is warm and dry. No rash noted. Not diaphoretic. No erythema. No pallor. NEUROLGIC: Alert and oriented to person, place, and time. PSYCHIATRIC: Normal mood and affect. Normal behavior. Normal judgment and thought content. CARDIOVASCULAR:Not Examined RESPIRATORY: Not Examined BREASTS: Not Examined ABDOMEN: Soft, non distended; Non tender.  No Organomegaly. PELVIC:  External Genitalia: Normal  BUS: Normal  Vagina: Normal, white particulate discharge , no tampon present  Cervix: Normal  Uterus: Normal size, shape,consistency, mobile  Adnexa: Normal, left sided tenderness   RV: Normal   Bladder: Nontender MUSCULOSKELETAL: Normal range of motion. No tenderness.  No cyanosis, clubbing, or edema.     Assessment:   1. Absence of menstruation - POCT urine pregnancy 2 Vaginal discharge 3 Pelvic pain      Plan:   Discussed use of birth control to control cycle. She states she sometimes has bleeding every 2 weeks. Which is why she thought she might be pregnancy because her last cycle was the beginning of June. Her pregnancy test today was negative. Reviewed BC options .  Recommend progestin only due to age and hx smoking. She is in agreement and would like to use the POP. Discussed importance of taking daily at same time . She verbalizes understanding. Vaginal swab collected . Will follow up with results. U/s ordered to evaluate for cause of pelvic pain. Will follow up with results.   Follow up for u/s or prn .   Doreene Burke, CNM

## 2023-04-25 NOTE — Patient Instructions (Signed)
Pelvic Pain, Female Pelvic pain is pain in your lower belly (abdomen), below your belly button and between your hips. The pain may: Start all of a sudden (be acute). Keep coming back (be recurring). Last a long time (become chronic). Pelvic pain that lasts longer than 6 months is called chronic pelvic pain. There are many causes of pelvic pain. Sometimes the cause of pelvic pain is not known. Follow these instructions at home:  Take over-the-counter and prescription medicines only as told by your doctor. Rest as told by your doctor. Do not have sex if it hurts. Keep a journal of your pelvic pain. Write down: When the pain started. Where the pain is located. What seems to make the pain better or worse, such as food or your monthly period (menstrual cycle). Any symptoms you have along with the pain. Keep all follow-up visits. Contact a doctor if: Medicine does not help your pain, or your pain comes back. You have new symptoms. You have unusual discharge or bleeding from your vagina. You have a fever or chills. You are having trouble pooping (constipation). You have blood in your pee (urine) or poop (stool). Your pee smells bad. You feel weak or light-headed. Get help right away if: You have sudden pain that is very bad. You have very bad pain and also have any of these symptoms: A fever. Feeling like you may vomit (nauseous). Vomiting. Being very sweaty. You faint. These symptoms may be an emergency. Get help right away. Call your local emergency services (911 in the [place] Do not wait to see if the symptoms will go away. Do not drive yourself to the hospital. Summary Pelvic pain is pain in your lower belly (abdomen), below your belly button and between your hips. There are many causes of pelvic pain. Keep a journal of your pelvic pain. This information is not intended to replace advice given to you by your health care provider. Make sure you discuss any questions you have with  your health care provider. Document Revised: 02/01/2021 Document Reviewed: 02/01/2021 Elsevier Patient Education  2024 Elsevier Inc.  

## 2023-04-26 LAB — CERVICOVAGINAL ANCILLARY ONLY
Bacterial Vaginitis (gardnerella): POSITIVE — AB
Candida Glabrata: NEGATIVE
Candida Vaginitis: NEGATIVE
Chlamydia: NEGATIVE
Comment: NEGATIVE
Comment: NEGATIVE
Comment: NEGATIVE
Comment: NEGATIVE
Comment: NEGATIVE
Comment: NORMAL
Neisseria Gonorrhea: NEGATIVE
Trichomonas: NEGATIVE

## 2023-04-27 ENCOUNTER — Encounter: Payer: Self-pay | Admitting: Certified Nurse Midwife

## 2023-04-27 ENCOUNTER — Ambulatory Visit: Payer: Medicaid Other | Admitting: Obstetrics

## 2023-04-29 ENCOUNTER — Encounter: Payer: Self-pay | Admitting: Certified Nurse Midwife

## 2023-04-29 ENCOUNTER — Other Ambulatory Visit: Payer: Self-pay | Admitting: Certified Nurse Midwife

## 2023-04-29 MED ORDER — METRONIDAZOLE 500 MG PO TABS: 500.0000 mg | ORAL_TABLET | Freq: Two times a day (BID) | ORAL | 0 refills | Status: DC

## 2023-04-30 MED ORDER — METRONIDAZOLE 0.75 % VA GEL: 1.0000 | Freq: Every day | VAGINAL | 0 refills | Status: DC

## 2023-04-30 NOTE — Telephone Encounter (Signed)
Gel prescribed per patient preference.

## 2023-05-07 ENCOUNTER — Other Ambulatory Visit: Payer: Self-pay

## 2023-05-07 DIAGNOSIS — N760 Acute vaginitis: Secondary | ICD-10-CM

## 2023-05-07 MED ORDER — METRONIDAZOLE 500 MG PO TABS
500.0000 mg | ORAL_TABLET | Freq: Two times a day (BID) | ORAL | 0 refills | Status: DC
Start: 2023-05-07 — End: 2023-12-31

## 2023-05-11 ENCOUNTER — Telehealth: Payer: Self-pay | Admitting: Certified Nurse Midwife

## 2023-05-11 ENCOUNTER — Other Ambulatory Visit: Payer: Medicaid Other

## 2023-05-11 NOTE — Telephone Encounter (Signed)
Patient called office back she is scheduled for her u/s on 05/28/2023 at 2:00pm

## 2023-05-11 NOTE — Telephone Encounter (Signed)
Contacted patient to reschedule missed ultrasound, No answer, LVM with contact information for scheduling.

## 2023-05-11 NOTE — Telephone Encounter (Signed)
Reached out to pt to reschedule gyn [place] that was scheduled on 05/11/2023 at 10:15.  Left message for pt to call back.

## 2023-05-28 ENCOUNTER — Other Ambulatory Visit: Payer: Medicaid Other

## 2023-06-04 ENCOUNTER — Telehealth: Payer: Medicaid Other | Admitting: Nurse Practitioner

## 2023-06-04 DIAGNOSIS — J329 Chronic sinusitis, unspecified: Secondary | ICD-10-CM

## 2023-06-04 DIAGNOSIS — B9789 Other viral agents as the cause of diseases classified elsewhere: Secondary | ICD-10-CM

## 2023-06-04 MED ORDER — IPRATROPIUM BROMIDE 0.03 % NA SOLN: 2.0000 | Freq: Two times a day (BID) | NASAL | 12 refills | Status: DC

## 2023-06-04 NOTE — Progress Notes (Signed)
E-Visit for Sinus Problems  We are sorry that you are not feeling well.  Here is how we plan to help!  Based on what you have shared with me it looks like you have sinusitis.  Sinusitis is inflammation and infection in the sinus cavities of the head.  Based on your presentation I believe you most likely have Acute Viral Sinusitis.This is an infection most likely caused by a virus. There is not specific treatment for viral sinusitis other than to help you with the symptoms until the infection runs its course.  You may use an oral decongestant such as Mucinex D or if you have glaucoma or high blood pressure use plain Mucinex. Saline nasal spray help and can safely be used as often as needed for congestion, I have prescribed: Ipratropium Bromide nasal spray 0.03% 2 sprays in eah nostril 2-3 times a day  We do not typically recommend antibiotics prior to 7-10 days of symptoms.  Providers prescribe antibiotics to treat infections caused by bacteria. Antibiotics are very powerful in treating bacterial infections when they are used properly. To maintain their effectiveness, they should be used only when necessary. Overuse of antibiotics has resulted in the development of superbugs that are resistant to treatment!    After careful review of your answers, I would not recommend an antibiotic for your condition.  Antibiotics are not effective against viruses and therefore should not be used to treat them. Common examples of infections caused by viruses include colds and flu   Some authorities believe that zinc sprays or the use of Echinacea may shorten the course of your symptoms.  Sinus infections are not as easily transmitted as other respiratory infection, however we still recommend that you avoid close contact with loved ones, especially the very young and elderly.  Remember to wash your hands thoroughly throughout the day as this is the number one way to prevent the spread of infection!  Home Care: Only  take medications as instructed by your medical team. Do not take these medications with alcohol. A steam or ultrasonic humidifier can help congestion.  You can place a towel over your head and breathe in the steam from hot water coming from a faucet. Avoid close contacts especially the very young and the elderly. Cover your mouth when you cough or sneeze. Always remember to wash your hands.  Get Help Right Away If: You develop worsening fever or sinus pain. You develop a severe head ache or visual changes. Your symptoms persist after you have completed your treatment plan.  Make sure you Understand these instructions. Will watch your condition. Will get help right away if you are not doing well or get worse.   Thank you for choosing an e-visit.  Your e-visit answers were reviewed by a board certified advanced clinical practitioner to complete your personal care plan. Depending upon the condition, your plan could have included both over the counter or prescription medications.  Please review your pharmacy choice. Make sure the pharmacy is open so you can pick up prescription now. If there is a problem, you may contact your provider through [employer] and have the prescription routed to another pharmacy.  Your safety is important to [place]. If you have drug allergies check your prescription carefully.   For the next 24 hours you can use MyChart to ask questions about today's visit, request a non-urgent call back, or ask for a work or school excuse. You will get an email in the next two days asking  about your experience. I hope that your e-visit has been valuable and will speed your recovery.  Meds ordered this encounter  Medications   ipratropium (ATROVENT) 0.03 % nasal spray    Sig: Place 2 sprays into both nostrils every 12 (twelve) hours.    Dispense:  30 mL    Refill:  12    I spent approximately 5 minutes reviewing the patient's history, current symptoms and coordinating their  care today.

## 2023-06-19 ENCOUNTER — Other Ambulatory Visit: Payer: Self-pay | Admitting: Certified Nurse Midwife

## 2023-06-19 ENCOUNTER — Ambulatory Visit (INDEPENDENT_AMBULATORY_CARE_PROVIDER_SITE_OTHER): Payer: Medicaid Other

## 2023-06-19 DIAGNOSIS — R1032 Left lower quadrant pain: Secondary | ICD-10-CM

## 2023-06-19 DIAGNOSIS — N912 Amenorrhea, unspecified: Secondary | ICD-10-CM

## 2023-06-19 DIAGNOSIS — N898 Other specified noninflammatory disorders of vagina: Secondary | ICD-10-CM

## 2023-06-28 ENCOUNTER — Encounter: Payer: Self-pay | Admitting: Certified Nurse Midwife

## 2023-07-02 NOTE — Telephone Encounter (Signed)
Patient following up regarding uls results. Patient aware. [name] is not in the office today, but will return tomorrow.

## 2023-09-11 ENCOUNTER — Ambulatory Visit: Payer: Medicaid Other

## 2023-09-11 NOTE — Progress Notes (Unsigned)
    NURSE VISIT NOTE  Subjective:    Patient ID: Cheyenne Weeks, female    DOB: Feb 06, 1981, 42 y.o.   MRN: [id number]  HPI  Patient is a 42 y.o. [id number] female who presents for evaluation of amenorrhea. She believes she could be pregnant. {pregnancy desire:14500::"Pregnancy is desired."} Sexual Activity: {sexual partners:705}. Current symptoms also include: {preg sx:18128}. Last period was {norm/abn:16337}.    Objective:    There were no vitals taken for this visit.  Lab Review  No results found for any visits on 09/11/23.  Assessment:   No diagnosis found.  Plan:   {AOB + PREGNANCY PLAN:28596:p}  {AOB - PREGNANCY PLAN:28597:p}   Cornelius Moras, CMA

## 2023-09-12 ENCOUNTER — Emergency Department
Admission: EM | Admit: 2023-09-12 | Discharge: 2023-09-12 | Disposition: A | Discharge status: Home / Self Care | Payer: Medicaid Other | Attending: Student in an Organized Health Care Education/Training Program | Admitting: Student in an Organized Health Care Education/Training Program

## 2023-09-12 ENCOUNTER — Emergency Department: Payer: Medicaid Other

## 2023-09-12 ENCOUNTER — Encounter: Payer: Self-pay | Admitting: Emergency Medicine

## 2023-09-12 ENCOUNTER — Other Ambulatory Visit: Payer: Self-pay

## 2023-09-12 DIAGNOSIS — Z3A01 Less than 8 weeks gestation of pregnancy: Secondary | ICD-10-CM | POA: Diagnosis not present

## 2023-09-12 DIAGNOSIS — E876 Hypokalemia: Secondary | ICD-10-CM | POA: Insufficient documentation

## 2023-09-12 DIAGNOSIS — R109 Unspecified abdominal pain: Secondary | ICD-10-CM | POA: Insufficient documentation

## 2023-09-12 DIAGNOSIS — O209 Hemorrhage in early pregnancy, unspecified: Secondary | ICD-10-CM | POA: Insufficient documentation

## 2023-09-12 DIAGNOSIS — O99281 Endocrine, nutritional and metabolic diseases complicating pregnancy, first trimester: Secondary | ICD-10-CM | POA: Insufficient documentation

## 2023-09-12 DIAGNOSIS — Z3491 Encounter for supervision of normal pregnancy, unspecified, first trimester: Secondary | ICD-10-CM

## 2023-09-12 LAB — URINALYSIS, ROUTINE W REFLEX MICROSCOPIC
Bilirubin Urine: NEGATIVE
Glucose, UA: NEGATIVE mg/dL
Ketones, ur: NEGATIVE mg/dL
Leukocytes,Ua: NEGATIVE
Nitrite: NEGATIVE
Protein, ur: NEGATIVE mg/dL
Specific Gravity, Urine: 1.025 (ref 1.005–1.030)
pH: 5 (ref 5.0–8.0)

## 2023-09-12 LAB — CBC
HCT: 36.6 % (ref 36.0–46.0)
Hemoglobin: 12.7 g/dL (ref 12.0–15.0)
MCH: 31.4 pg (ref 26.0–34.0)
MCHC: 34.7 g/dL (ref 30.0–36.0)
MCV: 90.4 fL (ref 80.0–100.0)
Platelets: 386 10*3/uL (ref 150–400)
RBC: 4.05 MIL/uL (ref 3.87–5.11)
RDW: 11.7 % (ref 11.5–15.5)
WBC: 7.2 10*3/uL (ref 4.0–10.5)
nRBC: 0 % (ref 0.0–0.2)

## 2023-09-12 LAB — HCG, QUANTITATIVE, PREGNANCY: hCG, Beta Chain, Quant, S: 37097 m[IU]/mL — ABNORMAL HIGH (ref <5)

## 2023-09-12 LAB — BASIC METABOLIC PANEL
Anion gap: 10 (ref 5–15)
BUN: 9 mg/dL (ref 6–20)
CO2: 22 mmol/L (ref 22–32)
Calcium: 9 mg/dL (ref 8.9–10.3)
Chloride: 103 mmol/L (ref 98–111)
Creatinine, Ser: 0.6 mg/dL (ref 0.44–1.00)
GFR, Estimated: >60 mL/min (ref >60)
Glucose, Bld: 128 mg/dL — ABNORMAL HIGH (ref 70–99)
Potassium: 3.2 mmol/L — ABNORMAL LOW (ref 3.5–5.1)
Sodium: 135 mmol/L (ref 135–145)

## 2023-09-12 LAB — ABO/RH: ABO/RH(D): O POS

## 2023-09-12 LAB — POC URINE PREG, ED: Preg Test, Ur: POSITIVE — AB

## 2023-09-12 NOTE — ED Triage Notes (Addendum)
Pt via POV from home. Pt unknown how many weeks she is. Physician did confirm pregnancy and told her she was in her 2nd trimester based on her hcg. Report LMP 10/19. Pt c/o vaginal bleeding and abd cramping starting last night around 0900pm. Report bleeding is light. Pt is A&Ox4 and NAD

## 2023-09-12 NOTE — Discharge Instructions (Signed)
Keep your appointment with your OB/GYN next week. Ultrasound shows a single baby at 6 weeks. Avoid taking anti-inflammatories for any discomfort.  It is safe to take Tylenol if needed.  Drink fluids to stay hydrated.

## 2023-09-12 NOTE — ED Provider Notes (Signed)
[facility] Provider Note    Event Date/Time   First MD Initiated Contact with Patient 09/12/23 0725     (approximate)   History   Vaginal Bleeding   HPI  Cheyenne Weeks is a 42 y.o. female   presents to the ED with complaint of vaginal bleeding that started at 9 PM last evening.  Patient reports that she got confirmation yesterday that she is pregnant and has an appointment with her OB/GYN next month.  Patient states that she has used 1 pad since bleeding began.  She also is experiencing some abdominal cramping.      Physical Exam   Triage Vital Signs: ED Triage Vitals  Encounter Vitals Group     BP 09/12/23 0715 137/84     Systolic BP Percentile --      Diastolic BP Percentile --      Pulse Rate 09/12/23 0715 (!) 102     Resp 09/12/23 0715 18     Temp 09/12/23 0715 97.8 F (36.6 C)     Temp Source 09/12/23 0715 Oral     SpO2 09/12/23 0715 98 %     Weight 09/12/23 0712 140 lb (63.5 kg)     Height 09/12/23 0712 5\' 5"  (1.651 m)     Head Circumference --      Peak Flow --      Pain Score 09/12/23 0712 4     Pain Loc --      Pain Education --      Exclude from Growth Chart --     Most recent vital signs: Vitals:   09/12/23 0715 09/12/23 1014  BP: 137/84 107/80  Pulse: (!) 102 91  Resp: 18 16  Temp: 97.8 F (36.6 C) 98.6 F (37 C)  SpO2: 98% 96%     General: Awake, no distress.  CV:  Good peripheral perfusion.  Heart regular rate rhythm. Resp:  Normal effort.  Lungs clear bilaterally. Abd:  No distention.  Other:     ED Results / Procedures / Treatments   Labs (all labs ordered are listed, but only abnormal results are displayed) Labs Reviewed  BASIC METABOLIC PANEL - Abnormal; Notable for the following components:      Result Value   Potassium 3.2 (*)    Glucose, Bld 128 (*)    All other components within normal limits  URINALYSIS, ROUTINE W REFLEX MICROSCOPIC - Abnormal; Notable for the following components:   Color,  Urine YELLOW (*)    APPearance CLOUDY (*)    Hgb urine dipstick SMALL (*)    Bacteria, UA RARE (*)    All other components within normal limits  HCG, QUANTITATIVE, PREGNANCY - Abnormal; Notable for the following components:   hCG, Beta Chain, Quant, S 37,097 (*)    All other components within normal limits  POC URINE PREG, ED - Abnormal; Notable for the following components:   Preg Test, Ur POSITIVE (*)    All other components within normal limits  CBC  ABO/RH     RADIOLOGY Ultrasound OB less than 14 weeks with transvaginal per radiologist shows a single IUP at 6 weeks with a heart rate of 100.    PROCEDURES:  Critical Care performed:   Procedures   MEDICATIONS ORDERED IN ED: Medications - No data to display   IMPRESSION / MDM / ASSESSMENT AND PLAN / ED COURSE  I reviewed the triage vital signs and the nursing notes.   Differential diagnosis includes, but  is not limited to, vaginal bleeding, pregnancy, subchorionic hemorrhage, menses, rule out ectopic pregnancy.  42 year old female presents to the ED with complaint of vaginal spotting that occurred at 9 PM last evening and additional cramping sensation today.  Reportedly patient's last period was in October.  hCG was 37,097 and urine test was positive.  CBC was unremarkable.  BMP did show slightly decreased potassium at 3.2 which we discussed would not be necessarily treated with medication but food high in potassium initially.  Urinalysis was unremarkable with exception of squamous epithelium suggesting this was not a clean-catch.  Ultrasound as noted above and patient was made aware that she has a single IUP measuring 6 weeks.  She is strongly encouraged to keep her appointment with her OB/GYN next week.  She is aware that she should return to the emergency department if any severe worsening of her symptoms or urgent concerns.    Clinical Course as of 09/12/23 1238  Wed Sep 12, 2023  1001 US OB LESS THAN 14 WEEKS WITH OB  TRANSVAGINAL [RS]    Clinical Course User Index [RS] Tommi Rumps, PA-C   Patient's presentation is most consistent with acute complicated illness / injury requiring diagnostic workup.  FINAL CLINICAL IMPRESSION(S) / ED DIAGNOSES   Final diagnoses:  First trimester pregnancy     Rx / DC Orders   ED Discharge Orders     None        Note:  This document was prepared using Dragon voice recognition software and may include unintentional dictation errors.   Tommi Rumps, PA-C 09/12/23 1238    Willy Eddy, MD 09/12/23 1249

## 2023-11-27 ENCOUNTER — Telehealth: Payer: Medicaid Other | Admitting: Physician Assistant

## 2023-11-27 DIAGNOSIS — K047 Periapical abscess without sinus: Secondary | ICD-10-CM | POA: Diagnosis not present

## 2023-11-27 MED ORDER — AMOXICILLIN 875 MG PO TABS: 875.0000 mg | ORAL_TABLET | Freq: Two times a day (BID) | ORAL | 0 refills | Status: AC

## 2023-11-27 NOTE — Progress Notes (Signed)

## 2023-11-27 NOTE — Progress Notes (Signed)
 I have spent 5 minutes in review of e-visit questionnaire, review and updating patient chart, medical decision making and response to patient.   Piedad Climes, PA-C

## 2023-12-30 ENCOUNTER — Telehealth: Admitting: Physician Assistant

## 2023-12-30 DIAGNOSIS — J208 Acute bronchitis due to other specified organisms: Secondary | ICD-10-CM | POA: Diagnosis not present

## 2023-12-31 ENCOUNTER — Encounter: Payer: Self-pay | Admitting: Physician Assistant

## 2023-12-31 MED ORDER — ALBUTEROL SULFATE HFA 108 (90 BASE) MCG/ACT IN AERS: 1.0000 | INHALATION_SPRAY | Freq: Four times a day (QID) | RESPIRATORY_TRACT | 0 refills | Status: DC | PRN

## 2023-12-31 MED ORDER — PREDNISONE 10 MG (21) PO TBPK: ORAL_TABLET | ORAL | 0 refills | Status: DC

## 2023-12-31 MED ORDER — AMOXICILLIN-POT CLAVULANATE 875-125 MG PO TABS: 1.0000 | ORAL_TABLET | Freq: Two times a day (BID) | ORAL | 0 refills | Status: DC

## 2023-12-31 MED ORDER — BENZONATATE 100 MG PO CAPS: 100.0000 mg | ORAL_CAPSULE | Freq: Three times a day (TID) | ORAL | 0 refills | Status: DC | PRN

## 2023-12-31 NOTE — Progress Notes (Signed)
 E-Visit for Cough   We are sorry that you are not feeling well.  Here is how we plan to help!  Based on your presentation I believe you most likely have A cough due to a virus.  This is called viral bronchitis and is best treated by rest, plenty of fluids and control of the cough.  You may use Ibuprofen or Tylenol as directed to help your symptoms.     In addition you may use A non-prescription cough medication called Mucinex DM: take 2 tablets every 12 hours. and A prescription cough medication called Tessalon Perles 100mg . You may take 1-2 capsules every 8 hours as needed for your cough.  I have prescribed:  Prednisone 10 mg daily for 6 days (see taper instructions below)  Directions for 6 day taper: Day 1: 2 tablets before breakfast, 1 after both lunch & dinner and 2 at bedtime Day 2: 1 tab before breakfast, 1 after both lunch & dinner and 2 at bedtime Day 3: 1 tab at each meal & 1 at bedtime Day 4: 1 tab at breakfast, 1 at lunch, 1 at bedtime Day 5: 1 tab at breakfast & 1 tab at bedtime Day 6: 1 tab at breakfast  AND  Albuterol inhaler Use 1-2 puffs every 6 hours as needed for shortness of breath, chest tightness, and/or wheezing.  From your responses in the eVisit questionnaire you describe inflammation in the upper respiratory tract which is causing a significant cough.  This is commonly called Bronchitis and has four common causes:   Allergies Viral Infections Acid Reflux Bacterial Infection Allergies, viruses and acid reflux are treated by controlling symptoms or eliminating the cause. An example might be a cough caused by taking certain blood pressure medications. You stop the cough by changing the medication. Another example might be a cough caused by acid reflux. Controlling the reflux helps control the cough.  USE OF BRONCHODILATOR ("RESCUE") INHALERS: There is a risk from using your bronchodilator too frequently.  The risk is that over-reliance on a medication which only  relaxes the muscles surrounding the breathing tubes can reduce the effectiveness of medications prescribed to reduce swelling and congestion of the tubes themselves.  Although you feel brief relief from the bronchodilator inhaler, your asthma may actually be worsening with the tubes becoming more swollen and filled with mucus.  This can delay other crucial treatments, such as oral steroid medications. If you need to use a bronchodilator inhaler daily, several times per day, you should discuss this with your provider.  There are probably better treatments that could be used to keep your asthma under control.     HOME CARE Only take medications as instructed by your medical team. Complete the entire course of an antibiotic. Drink plenty of fluids and get plenty of rest. Avoid close contacts especially the very young and the elderly Cover your mouth if you cough or cough into your sleeve. Always remember to wash your hands A steam or ultrasonic humidifier can help congestion.   GET HELP RIGHT AWAY IF: You develop worsening fever. You become short of breath You cough up blood. Your symptoms persist after you have completed your treatment plan MAKE SURE YOU  Understand these instructions. Will watch your condition. Will get help right away if you are not doing well or get worse.    Thank you for choosing an e-visit.  Your e-visit answers were reviewed by a board certified advanced clinical practitioner to complete your personal care plan. Depending  upon the condition, your plan could have included both over the counter or prescription medications.  Please review your pharmacy choice. Make sure the pharmacy is open so you can pick up prescription now. If there is a problem, you may contact your provider through [employer] and have the prescription routed to another pharmacy.  Your safety is important to [place]. If you have drug allergies check your prescription carefully.   For the next 24  hours you can use MyChart to ask questions about today's visit, request a non-urgent call back, or ask for a work or school excuse. You will get an email in the next two days asking about your experience. I hope that your e-visit has been valuable and will speed your recovery.   I have spent 5 minutes in review of e-visit questionnaire, review and updating patient chart, medical decision making and response to patient.   Margaretann Loveless, PA-C

## 2023-12-31 NOTE — Addendum Note (Signed)
 Addended by: Waldon Merl on: 12/31/2023 02:34 PM   Modules accepted: Orders

## 2024-01-14 ENCOUNTER — Telehealth: Admitting: Physician Assistant

## 2024-01-14 DIAGNOSIS — H01139 Eczematous dermatitis of unspecified eye, unspecified eyelid: Secondary | ICD-10-CM | POA: Diagnosis not present

## 2024-01-14 MED ORDER — TRIAMCINOLONE ACETONIDE 0.025 % EX OINT: 1.0000 | TOPICAL_OINTMENT | Freq: Two times a day (BID) | CUTANEOUS | 0 refills | Status: DC

## 2024-01-14 NOTE — Progress Notes (Signed)

## 2024-01-25 ENCOUNTER — Telehealth: Admitting: Nurse Practitioner

## 2024-01-25 DIAGNOSIS — B379 Candidiasis, unspecified: Secondary | ICD-10-CM

## 2024-01-25 DIAGNOSIS — T3695XA Adverse effect of unspecified systemic antibiotic, initial encounter: Secondary | ICD-10-CM | POA: Diagnosis not present

## 2024-01-25 MED ORDER — FLUCONAZOLE 150 MG PO TABS: 150.0000 mg | ORAL_TABLET | Freq: Once | ORAL | 0 refills | Status: AC

## 2024-01-25 NOTE — Progress Notes (Signed)

## 2024-02-10 ENCOUNTER — Other Ambulatory Visit: Payer: Self-pay

## 2024-02-10 ENCOUNTER — Emergency Department
Admission: EM | Admit: 2024-02-10 | Discharge: 2024-02-11 | Disposition: A | Discharge status: Home / Self Care | Attending: Emergency Medicine | Admitting: Emergency Medicine

## 2024-02-10 DIAGNOSIS — R109 Unspecified abdominal pain: Secondary | ICD-10-CM | POA: Diagnosis present

## 2024-02-10 DIAGNOSIS — N898 Other specified noninflammatory disorders of vagina: Secondary | ICD-10-CM

## 2024-02-10 DIAGNOSIS — N72 Inflammatory disease of cervix uteri: Secondary | ICD-10-CM | POA: Insufficient documentation

## 2024-02-10 LAB — CBC WITH DIFFERENTIAL/PLATELET
Abs Immature Granulocytes: 0.01 10*3/uL (ref 0.00–0.07)
Basophils Absolute: 0.1 10*3/uL (ref 0.0–0.1)
Basophils Relative: 1 %
Eosinophils Absolute: 0 10*3/uL (ref 0.0–0.5)
Eosinophils Relative: 1 %
HCT: 40.6 % (ref 36.0–46.0)
Hemoglobin: 13.9 g/dL (ref 12.0–15.0)
Immature Granulocytes: 0 %
Lymphocytes Relative: 27 %
Lymphs Abs: 1.9 10*3/uL (ref 0.7–4.0)
MCH: 30.5 pg (ref 26.0–34.0)
MCHC: 34.2 g/dL (ref 30.0–36.0)
MCV: 89.2 fL (ref 80.0–100.0)
Monocytes Absolute: 0.7 10*3/uL (ref 0.1–1.0)
Monocytes Relative: 10 %
Neutro Abs: 4.3 10*3/uL (ref 1.7–7.7)
Neutrophils Relative %: 61 %
Platelets: 382 10*3/uL (ref 150–400)
RBC: 4.55 MIL/uL (ref 3.87–5.11)
RDW: 11.5 % (ref 11.5–15.5)
WBC: 7 10*3/uL (ref 4.0–10.5)
nRBC: 0 % (ref 0.0–0.2)

## 2024-02-10 LAB — PREGNANCY, URINE: Preg Test, Ur: NEGATIVE

## 2024-02-10 LAB — COMPREHENSIVE METABOLIC PANEL WITH GFR
ALT: 17 U/L (ref 0–44)
AST: 26 U/L (ref 15–41)
Albumin: 4.4 g/dL (ref 3.5–5.0)
Alkaline Phosphatase: 48 U/L (ref 38–126)
Anion gap: 9 (ref 5–15)
BUN: 14 mg/dL (ref 6–20)
CO2: 27 mmol/L (ref 22–32)
Calcium: 9.3 mg/dL (ref 8.9–10.3)
Chloride: 101 mmol/L (ref 98–111)
Creatinine, Ser: 0.81 mg/dL (ref 0.44–1.00)
GFR, Estimated: >60 mL/min (ref >60)
Glucose, Bld: 101 mg/dL — ABNORMAL HIGH (ref 70–99)
Potassium: 3.7 mmol/L (ref 3.5–5.1)
Sodium: 137 mmol/L (ref 135–145)
Total Bilirubin: 0.6 mg/dL (ref 0.0–1.2)
Total Protein: 7.5 g/dL (ref 6.5–8.1)

## 2024-02-10 LAB — URINALYSIS, ROUTINE W REFLEX MICROSCOPIC
Bacteria, UA: NONE SEEN
Bilirubin Urine: NEGATIVE
Glucose, UA: NEGATIVE mg/dL
Hgb urine dipstick: NEGATIVE
Ketones, ur: 5 mg/dL — AB
Nitrite: NEGATIVE
Protein, ur: 30 mg/dL — AB
Specific Gravity, Urine: 1.031 — ABNORMAL HIGH (ref 1.005–1.030)
pH: 6 (ref 5.0–8.0)

## 2024-02-10 NOTE — ED Triage Notes (Signed)
 Pt arrives via POV with CC of tampon that has been in for the last month due to forgetting it was there. States unable to retreive tampon and that same has happened before. Reports vomiting - eating meal in triage. Ambulatory without signs of distress. VSS.

## 2024-02-10 NOTE — ED Provider Notes (Incomplete)
 [facility] Provider Note    Event Date/Time   First MD Initiated Contact with Patient 02/10/24 2120     (approximate)   History   No chief complaint on file.    HPI  Cheyenne Weeks is a 43 y.o. female    with a past medical history of antibiotic induced yeast infection, dental infection, viral bronchitis, bacterial vaginosis, vaginal candidiasis, UTI, trichomonas cervicitis miscarriage, with no significant past medical history who presents to the ED complaining of tampon that she did not remove in the last 3 weeks.   According to the patient, she forgot that she was having the t tampon, patient states having intermittent fevers, abdominal pain, vomiting, vaginal discharge brownish color, malodorous.  Patient states she have same episode a few years ago.      Physical Exam   Triage Vital Signs: ED Triage Vitals  Encounter Vitals Group     BP 02/10/24 2005 (!) 141/97     Systolic BP Percentile --      Diastolic BP Percentile --      Pulse Rate 02/10/24 2005 98     Resp 02/10/24 2005 17     Temp 02/10/24 2001 98.8 F (37.1 C)     Temp Source 02/10/24 2001 Oral     SpO2 02/10/24 2005 100 %     Weight 02/10/24 2001 140 lb (63.5 kg)     Height 02/10/24 2001 5\' 5"  (1.651 m)     Head Circumference --      Peak Flow --      Pain Score 02/10/24 2001 8     Pain Loc --      Pain Education --      Exclude from Growth Chart --     Most recent vital signs: Vitals:   02/10/24 2001 02/10/24 2005  BP:  (!) 141/97  Pulse:  98  Resp:  17  Temp: 98.8 F (37.1 C)   SpO2:  100%     Constitutional: Alert, NAD. Able to speak in complete sentences without cough or dyspnea  Eyes: Conjunctivae are normal.  Head: Atraumatic. Nose: No congestion/rhinnorhea. Mouth/Throat: Mucous membranes are moist.   Neck: Painless ROM. Supple. No JVD, nodes, thyromegaly  Cardiovascular:   Good peripheral circulation.RRR no murmurs, gallops, rubs  Respiratory: Normal  respiratory effort.  No retractions. Clear to auscultation bilaterally without wheezing or crackles  Gastrointestinal: Skin is intact, without ecchymosis or hematomas bowel sounds positive, McBurney point negative, Rovsing negative, rebound positive .  Soft but tender to palpation in pelvic area  Musculoskeletal:  no deformity Neurologic:  MAE spontaneously. No gross focal neurologic deficits are appreciated.  Skin:  Skin is warm, dry and intact. No rash noted. Psychiatric: Mood and affect are normal. Speech and behavior are normal Genitourinary: Chaperon is present, vaginal physical exam: No presence of foreign body in vaginal canal, cervix tender to palpation, chandelier sign positive.  Speculoscopy: No presence of foreign body, vaginal discharge malodorous, brownish color.  Collected samples for chlamydia and gonorrhea NAAT.     ED Results / Procedures / Treatments   Labs (all labs ordered are listed, but only abnormal results are displayed) Labs Reviewed - No data to display   EKG See physician read***    RADIOLOGY I independently reviewed and interpreted imaging and agree with radiologists findings.  ***    PROCEDURES:  Critical Care performed:   .Pelvic exam  Date/Time: 02/10/2024 10:55 PM  Performed by: Awilda Lennox, PA-C Authorized  by: Awilda Lennox, PA-C  Consent: Verbal consent obtained. Consent given by: patient Patient understanding: patient states understanding of the procedure being performed Patient identity confirmed: verbally with patient Preparation: Patient was prepped and draped in the usual sterile fashion. Local anesthesia used: no  Anesthesia: Local anesthesia used: no Comments: No presence of foreign body, vaginal discharge, brownish color, malodorous, chandelier sign positive      MEDICATIONS ORDERED IN ED: Medications - No data to display    IMPRESSION / MDM / ASSESSMENT AND PLAN / ED COURSE  I reviewed the triage vital signs and  the nursing notes.  Differential diagnosis includes, but is not limited to, PID, intravaginal foreign body, STD, sepsis, appendicitis, ectopic pregnancy  Patient's presentation is most consistent with acute complicated illness / injury requiring diagnostic workup.  Patient's diagnosis is consistent with ***. I independently reviewed and interpreted imaging and agree with radiologists findings. Labs are  reassuring***. I did review the patient's allergies and medications.The patient is in stable and satisfactory condition for discharge home  Patient will be discharged home with prescriptions for ***. Patient is to follow up with *** as needed or otherwise directed. Patient is given ED precautions to return to the ED for any worsening or new symptoms. Discussed plan of care with patient, answered all of patient's questions, Patient agreeable to plan of care. Advised patient to take medications according to the instructions on the label. Discussed possible side effects of new medications. Patient verbalized understanding.    FINAL CLINICAL IMPRESSION(S) / ED DIAGNOSES   Final diagnoses:  None     Rx / DC Orders   ED Discharge Orders     None        Note:  This document was prepared using Dragon voice recognition software and may include unintentional dictation errors.

## 2024-02-10 NOTE — ED Provider Notes (Signed)
 [facility] Provider Note    Event Date/Time   First MD Initiated Contact with Patient 02/10/24 2120     (approximate)   History   No chief complaint on file.    HPI  Cheyenne Weeks is a 43 y.o. female    with a past medical history of antibiotic induced yeast infection, dental infection, viral bronchitis, bacterial vaginosis, vaginal candidiasis, UTI, trichomonas cervicitis miscarriage, with no significant past medical history who presents to the ED complaining of tampon that she did not remove in the last 3 weeks.   According to the patient, she forgot that she was having the tampon, patient states having intermittent fevers, abdominal pain, vomiting, vaginal discharge brownish color, malodorous.  Patient states she have same episode a few years ago.      Physical Exam   Triage Vital Signs: ED Triage Vitals  Encounter Vitals Group     BP 02/10/24 2005 (!) 141/97     Systolic BP Percentile --      Diastolic BP Percentile --      Pulse Rate 02/10/24 2005 98     Resp 02/10/24 2005 17     Temp 02/10/24 2001 98.8 F (37.1 C)     Temp Source 02/10/24 2001 Oral     SpO2 02/10/24 2005 100 %     Weight 02/10/24 2001 140 lb (63.5 kg)     Height 02/10/24 2001 5\' 5"  (1.651 m)     Head Circumference --      Peak Flow --      Pain Score 02/10/24 2001 8     Pain Loc --      Pain Education --      Exclude from Growth Chart --     Most recent vital signs: Vitals:   02/10/24 2001 02/10/24 2005  BP:  (!) 141/97  Pulse:  98  Resp:  17  Temp: 98.8 F (37.1 C)   SpO2:  100%     Constitutional: Alert, NAD. Able to speak in complete sentences without cough or dyspnea  Eyes: Conjunctivae are normal.  Head: Atraumatic. Nose: No congestion/rhinnorhea. Mouth/Throat: Mucous membranes are moist.   Neck: Painless ROM. Supple. No JVD, nodes, thyromegaly  Cardiovascular:   Good peripheral circulation.RRR no murmurs, gallops, rubs  Respiratory: Normal  respiratory effort.  No retractions. Clear to auscultation bilaterally without wheezing or crackles  Gastrointestinal: Skin is intact, without ecchymosis or hematomas bowel sounds positive, McBurney point negative, Rovsing negative, rebound positive .  Soft but tender to palpation in pelvic area  Musculoskeletal:  no deformity Neurologic:  MAE spontaneously. No gross focal neurologic deficits are appreciated.  Skin:  Skin is warm, dry and intact. No rash noted. Psychiatric: Mood and affect are normal. Speech and behavior are normal Genitourinary: Chaperon is present, vaginal physical exam: No presence of foreign body in vaginal canal, cervix tender to palpation, chandelier sign positive.  Speculoscopy: No presence of foreign body, vaginal discharge malodorous, brownish color.  Collected samples for chlamydia and gonorrhea NAAT.     ED Results / Procedures / Treatments   Labs (all labs ordered are listed, but only abnormal results are displayed) Labs Reviewed  COMPREHENSIVE METABOLIC PANEL WITH GFR - Abnormal; Notable for the following components:      Result Value   Glucose, Bld 101 (*)    All other components within normal limits  URINALYSIS, ROUTINE W REFLEX MICROSCOPIC - Abnormal; Notable for the following components:   Color, Urine YELLOW (*)  APPearance HAZY (*)    Specific Gravity, Urine 1.031 (*)    Ketones, ur 5 (*)    Protein, ur 30 (*)    Leukocytes,Ua TRACE (*)    All other components within normal limits  CHLAMYDIA/NGC RT PCR ([facility] ONLY)            CBC WITH DIFFERENTIAL/PLATELET  PREGNANCY, URINE  HIV ANTIBODY (ROUTINE TESTING W REFLEX)  POC URINE PREG, ED     EKG     RADIOLOGY     PROCEDURES:  Critical Care performed:   .Pelvic exam  Date/Time: 02/10/2024 10:55 PM  Performed by: Awilda Lennox, PA-C Authorized by: Awilda Lennox, PA-C  Consent: Verbal consent obtained. Consent given by: patient Patient understanding: patient states understanding  of the procedure being performed Patient identity confirmed: verbally with patient Preparation: Patient was prepped and draped in the usual sterile fashion. Local anesthesia used: no  Anesthesia: Local anesthesia used: no Comments: No presence of foreign body, vaginal discharge, brownish color, malodorous, chandelier sign positive      MEDICATIONS ORDERED IN ED: Medications - No data to display Clinical Course as of 02/11/24 0020  Sun Feb 10, 2024  2320 Pregnancy, urine Negative [AE]  2321 CBC with Differential White blood cells, hemoglobin, platelets within normal limits [AE]  2321 Urinalysis, Routine w reflex microscopic -Urine, Clean Catch(!) Ketones positive protein positive leukocyte esterase [AE]  Mon Feb 11, 2024  0016 Updated patient with results of CBC CMP UA and pregnancy.  Transferred care to Dr. Siadecki [AE]    Clinical Course User Index [AE] Asa Baudoin, PA-C    IMPRESSION / MDM / ASSESSMENT AND PLAN / ED COURSE  I reviewed the triage vital signs and the nursing notes.  Differential diagnosis includes, but is not limited to, PID, intravaginal foreign body, STD, sepsis, appendicitis, ectopic pregnancy  Patient's presentation is most consistent with acute complicated illness / injury requiring diagnostic workup.  I transferred care to Dr. Siadaki at 12:20 AM. Pending report of NAAT from cervical swab to determine mine disposition.  Updated patient.    FINAL CLINICAL IMPRESSION(S) / ED DIAGNOSES   Final diagnoses:  Cervicitis due to infection     Rx / DC Orders   ED Discharge Orders     None        Note:  This document was prepared using Dragon voice recognition software and may include unintentional dictation errors.   Awilda Lennox, PA-C 02/11/24 Annemarie Barry, MD 02/11/24 [phone]

## 2024-02-11 ENCOUNTER — Telehealth

## 2024-02-11 DIAGNOSIS — N898 Other specified noninflammatory disorders of vagina: Secondary | ICD-10-CM

## 2024-02-11 LAB — CHLAMYDIA/NGC RT PCR (ARMC ONLY)
Chlamydia Tr: NOT DETECTED
N gonorrhoeae: NOT DETECTED

## 2024-02-11 LAB — HIV ANTIBODY (ROUTINE TESTING W REFLEX): HIV Screen 4th Generation wRfx: NONREACTIVE

## 2024-02-11 NOTE — Discharge Instructions (Signed)
 Follow-up with OB/GYN.  Return to the ER for new, worsening, or persistent severe vaginal pain, irritation, discharge, bleeding, or any other new or worsening symptoms that concern you.

## 2024-02-12 ENCOUNTER — Encounter: Payer: Self-pay | Admitting: Obstetrics and Gynecology

## 2024-02-12 ENCOUNTER — Ambulatory Visit (INDEPENDENT_AMBULATORY_CARE_PROVIDER_SITE_OTHER): Admitting: Obstetrics and Gynecology

## 2024-02-12 ENCOUNTER — Other Ambulatory Visit (HOSPITAL_COMMUNITY)
Admission: RE | Admit: 2024-02-12 | Discharge: 2024-02-12 | Disposition: A | Discharge status: Home / Self Care | Source: Ambulatory Visit | Attending: Obstetrics and Gynecology | Admitting: Obstetrics and Gynecology

## 2024-02-12 VITALS — BP 108/68 | HR 84 | Ht 65.0 in | Wt 130.0 lb

## 2024-02-12 DIAGNOSIS — N760 Acute vaginitis: Secondary | ICD-10-CM

## 2024-02-12 DIAGNOSIS — B9689 Other specified bacterial agents as the cause of diseases classified elsewhere: Secondary | ICD-10-CM | POA: Diagnosis not present

## 2024-02-12 DIAGNOSIS — R87610 Atypical squamous cells of undetermined significance on cytologic smear of cervix (ASC-US): Secondary | ICD-10-CM | POA: Insufficient documentation

## 2024-02-12 DIAGNOSIS — Z1151 Encounter for screening for human papillomavirus (HPV): Secondary | ICD-10-CM | POA: Insufficient documentation

## 2024-02-12 DIAGNOSIS — Z124 Encounter for screening for malignant neoplasm of cervix: Secondary | ICD-10-CM | POA: Insufficient documentation

## 2024-02-12 DIAGNOSIS — R102 Pelvic and perineal pain: Secondary | ICD-10-CM | POA: Diagnosis not present

## 2024-02-12 DIAGNOSIS — N939 Abnormal uterine and vaginal bleeding, unspecified: Secondary | ICD-10-CM | POA: Diagnosis not present

## 2024-02-12 LAB — POCT WET PREP WITH KOH
Clue Cells Wet Prep HPF POC: POSITIVE
KOH Prep POC: POSITIVE — AB
Trichomonas, UA: NEGATIVE
Yeast Wet Prep HPF POC: NEGATIVE

## 2024-02-12 MED ORDER — METRONIDAZOLE 500 MG PO TABS: ORAL_TABLET | ORAL | 0 refills | Status: DC

## 2024-02-12 NOTE — Progress Notes (Signed)
 [facility]   Chief Complaint  Patient presents with   Follow-up    Abnormal odor, on/off vag bleeding, left side pelvic pain. Had thoughts of a possible retained tampon.    HPI:      [name] is a 43 y.o. [id number] whose LMP was Patient's last menstrual period was 01/15/2024 (approximate)., presents today for vaginal odor intermittently recently and AUB. Pt went to ED 02/10/24 thinking she had retained tampon causing sx. Had neg exam, neg STD testing.  Menses are usually monthly, lasting 3-4 days, mod flow, no BTB, mild dysmen QO month. Has had light spotting/with wiping for the past few days; LMP started on time. Had neg UPT in ED.  Hx of BV in past, no testing done in ED. Pt took 1 leftover amox and flagyl  a few days ago. Odor not as bad today.  Has also had pelvic pressure recently. No urin sx. Has had n/v/d and some fevers/chills recently but thinks she has IBS and pelvic sx new.  Pt has been currently sexually active but doesn't plan to be anymore. States past partner was extremely fertile and she has had a couple miscarriages this yr already and did medication termination a few months ago.  Last pap 6 /22 ascus/neg; due for repeat  Patient Active Problem List   Diagnosis Date Noted   ASCUS of cervix with negative high risk HPV 02/12/2024   Trichomoniasis 11/16/2021    Past Surgical History:  Procedure Laterality Date   CESAREAN SECTION      History reviewed. No pertinent family history.  Social History   Socioeconomic History   Marital status: Single    Spouse name: Not on file   Number of children: Not on file   Years of education: Not on file   Highest education level: Not on file  Occupational History   Not on file  Tobacco Use   Smoking status: Every Day    Current packs/day: 0.50    Types: Cigarettes   Smokeless tobacco: Never  Vaping Use   Vaping status: Every Day  Substance and Sexual Activity   Alcohol use: Yes    Comment:  occasionally   Drug use: Never   Sexual activity: Not Currently    Birth control/protection: None  Other Topics Concern   Not on file  Social History Narrative   Not on file   Social Drivers of Health   Financial Resource Strain: Not on file  Food Insecurity: Not on file  Transportation Needs: Not on file  Physical Activity: Not on file  Stress: Not on file  Social Connections: Not on file  Intimate Partner Violence: Not on file    Outpatient Medications Prior to Visit  Medication Sig Dispense Refill   albuterol  (VENTOLIN  HFA) 108 (90 Base) MCG/ACT inhaler Inhale 1-2 puffs into the lungs every 6 (six) hours as needed. 8 g 0   amphetamine-dextroamphetamine (ADDERALL) 20 MG tablet Take 20 mg by mouth 3 (three) times daily.     fluticasone  (FLONASE ) 50 MCG/ACT nasal spray Place 2 sprays into both nostrils daily. 16 g 6   LORazepam  (ATIVAN ) 1 MG tablet Take 1 mg by mouth 2 (two) times daily.     triamcinolone  (KENALOG ) 0.025 % ointment Apply 1 Application topically 2 (two) times daily. 30 g 0   amoxicillin -clavulanate (AUGMENTIN ) 875-125 MG tablet Take 1 tablet by mouth 2 (two) times daily. 14 tablet 0   benzonatate  (TESSALON ) 100 MG capsule Take 1-2 capsules (  100-200 mg total) by mouth 3 (three) times daily as needed. 30 capsule 0   dextroamphetamine (DEXTROSTAT) 10 MG tablet Take 15 mg by mouth daily.     hydrocortisone  (ANUSOL -HC) 2.5 % rectal cream Place 1 Application rectally 2 (two) times daily. 30 g 0   ipratropium (ATROVENT ) 0.03 % nasal spray Place 2 sprays into both nostrils every 12 (twelve) hours. 30 mL 12   norethindrone  (MICRONOR ) 0.35 MG tablet Take 1 tablet (0.35 mg total) by mouth daily. 90 tablet 3   predniSONE  (STERAPRED UNI-PAK 21 TAB) 10 MG (21) TBPK tablet 6 day taper; take as directed on package instructions 21 tablet 0   VYVANSE 40 MG capsule Take 40 mg by mouth every morning.     No facility-administered medications prior to visit.      ROS:  Review of  Systems  Constitutional:  Negative for fever.  Gastrointestinal:  Positive for diarrhea, nausea and vomiting. Negative for blood in stool and constipation.  Genitourinary:  Positive for menstrual problem, pelvic pain and vaginal discharge. Negative for dyspareunia, dysuria, flank pain, frequency, hematuria, urgency, vaginal bleeding and vaginal pain.  Musculoskeletal:  Negative for back pain.  Skin:  Negative for rash.   BREAST: No symptoms   OBJECTIVE:   Vitals:  BP 108/68   Pulse 84   Ht 5\' 5"  (1.651 m)   Wt 130 lb (59 kg)   LMP 01/15/2024 (Approximate)   BMI 21.63 kg/m   Physical Exam Vitals reviewed.  Constitutional:      Appearance: She is well-developed.  Pulmonary:     Effort: Pulmonary effort is normal.  Genitourinary:    General: Normal vulva.     Pubic Area: No rash.      Labia:        Right: No rash, tenderness or lesion.        Left: No rash, tenderness or lesion.      Vagina: Bleeding present. No vaginal discharge, erythema or tenderness.     Cervix: Normal.     Uterus: Normal. Not enlarged and not tender.      Adnexa: Right adnexa normal and left adnexa normal.       Right: No mass or tenderness.         Left: No mass or tenderness.       Comments: SMALL AMT BLEEDING Musculoskeletal:        General: Normal range of motion.     Cervical back: Normal range of motion.  Skin:    General: Skin is warm and dry.  Neurological:     General: No focal deficit present.     Mental Status: She is alert and oriented to person, place, and time.  Psychiatric:        Mood and Affect: Mood normal.        Behavior: Behavior normal.        Thought Content: Thought content normal.        Judgment: Judgment normal.     Results: Results for orders placed or performed in visit on 02/12/24 (from the past 24 hours)  POCT Wet Prep with KOH     Status: Abnormal   Collection Time: 02/12/24  4:40 PM  Result Value Ref Range   Trichomonas, UA Negative    Clue Cells Wet  Prep HPF POC pos    Epithelial Wet Prep HPF POC     Yeast Wet Prep HPF POC neg    Bacteria Wet Prep HPF POC  RBC Wet Prep HPF POC     WBC Wet Prep HPF POC     KOH Prep POC Positive (A) Negative     Assessment/Plan: BV (bacterial vaginosis) - Plan: POCT Wet Prep with KOH, metroNIDAZOLE  (FLAGYL ) 500 MG tablet; pos sx and wet prep. Rx flagyl , no EtOH. F/u prn. May be causing pelvic pressure.   Abnormal uterine bleeding (AUB)--this 1 episode, neg UPT. Reassurance this cycle. F/u if sx persist next month for Gyn u/s and more labs.   Pelvic pressure in female--will chck Gyn u/s if sx persist. Pt doesn't think due to GI sx.   Cervical cancer screening - Plan: Cytology - PAP  Screening for HPV (human papillomavirus) - Plan: Cytology - PAP  ASCUS of cervix with negative high risk HPV - Plan: Cytology - PAP; repeat today, will f/u if abn.    Meds ordered this encounter  Medications   metroNIDAZOLE  (FLAGYL ) 500 MG tablet    Sig: Take 1 tab BID for 7 days; NO alcohol use for 10 days after prescription start    Dispense:  14 tablet    Refill:  0    Supervising Provider:   ROBY, MICIA [[id number]]      No follow-ups on file.  Suliman Termini B. Colby Catanese, PA-C 02/12/2024 4:41 PM

## 2024-02-12 NOTE — Progress Notes (Signed)
 Because you were seen for a tampon that was left in and you are having symptoms- you need to follow up in person for treatment regarding on going care for this.    NOTE: There will be NO CHARGE for this E-Visit   If you are having a true medical emergency, please call 911.

## 2024-02-12 NOTE — Patient Instructions (Signed)
 I value your feedback and you entrusting [place] with your care. If you get a [place] patient survey, I would appreciate you taking the time to let us know about your experience today. Thank you! ? ? ?

## 2024-02-15 LAB — CYTOLOGY - PAP
Comment: NEGATIVE
Diagnosis: NEGATIVE
High risk HPV: NEGATIVE

## 2024-02-22 ENCOUNTER — Telehealth: Admitting: Nurse Practitioner

## 2024-02-22 DIAGNOSIS — K047 Periapical abscess without sinus: Secondary | ICD-10-CM

## 2024-02-22 MED ORDER — PENICILLIN V POTASSIUM 500 MG PO TABS: 500.0000 mg | ORAL_TABLET | Freq: Three times a day (TID) | ORAL | 0 refills | Status: AC

## 2024-02-22 NOTE — Progress Notes (Signed)
 E-Visit for Dental Pain  We are sorry that you are not feeling well.  Here is how we plan to help!  Based on what you have shared with me in the questionnaire, it sounds like you have an infection under one of your teeth  Pen VK 500mg  3 times a day for 7 days  It is imperative that you see a dentist within 10 days of this eVisit to determine the cause of the dental pain and be sure it is adequately treated  A toothache or tooth pain is caused when the nerve in the root of a tooth or surrounding a tooth is irritated. Dental (tooth) infection, decay, injury, or loss of a tooth are the most common causes of dental pain. Pain may also occur after an extraction (tooth is pulled out). Pain sometimes originates from other areas and radiates to the jaw, thus appearing to be tooth pain.Bacteria growing inside your mouth can contribute to gum disease and dental decay, both of which can cause pain. A toothache occurs from inflammation of the central portion of the tooth called pulp. The pulp contains nerve endings that are very sensitive to pain. Inflammation to the pulp or pulpitis may be caused by dental cavities, trauma, and infection.    HOME CARE:   For toothaches: Over-the-counter pain medications such as acetaminophen or ibuprofen may be used. Take these as directed on the package while you arrange for a dental appointment. Avoid very cold or hot foods, because they may make the pain worse. You may get relief from biting on a cotton ball soaked in oil of cloves. You can get oil of cloves at most drug stores.  For jaw pain:  Aspirin may be helpful for problems in the joint of the jaw in adults. If pain happens every time you open your mouth widely, the temporomandibular joint (TMJ) may be the source of the pain. Yawning or taking a large bite of food may worsen the pain. An appointment with your doctor or dentist will help you find the cause.     GET HELP RIGHT AWAY IF:  You have a high fever  or chills If you have had a recent head or face injury and develop headache, light headedness, nausea, vomiting, or other symptoms that concern you after an injury to your face or mouth, you could have a more serious injury in addition to your dental injury. A facial rash associated with a toothache: This condition may improve with medication. Contact your doctor for them to decide what is appropriate. Any jaw pain occurring with chest pain: Although jaw pain is most commonly caused by dental disease, it is sometimes referred pain from other areas. People with heart disease, especially people who have had stents placed, people with diabetes, or those who have had heart surgery may have jaw pain as a symptom of heart attack or angina. If your jaw or tooth pain is associated with lightheadedness, sweating, or shortness of breath, you should see a doctor as soon as possible. Trouble swallowing or excessive pain or bleeding from gums: If you have a history of a weakened immune system, diabetes, or steroid use, you may be more susceptible to infections. Infections can often be more severe and extensive or caused by unusual organisms. Dental and gum infections in people with these conditions may require more aggressive treatment. An abscess may need draining or IV antibiotics, for example.  MAKE SURE YOU   Understand these instructions. Will watch your condition. Will get  help right away if you are not doing well or get worse.  Thank you for choosing an e-visit.  Your e-visit answers were reviewed by a board certified advanced clinical practitioner to complete your personal care plan. Depending upon the condition, your plan could have included both over the counter or prescription medications.  Please review your pharmacy choice. Make sure the pharmacy is open so you can pick up prescription now. If there is a problem, you may contact your provider through [employer] and have the prescription routed  to another pharmacy.  Your safety is important to us . If you have drug allergies check your prescription carefully.   For the next 24 hours you can use MyChart to ask questions about today's visit, request a non-urgent call back, or ask for a work or school excuse. You will get an email in the next two days asking about your experience. I hope that your e-visit has been valuable and will speed your recovery.  I spent approximately 5 minutes reviewing the patient's history, current symptoms and coordinating their care today.

## 2024-02-25 ENCOUNTER — Ambulatory Visit

## 2024-03-13 ENCOUNTER — Telehealth: Admitting: Physician Assistant

## 2024-03-13 DIAGNOSIS — R197 Diarrhea, unspecified: Secondary | ICD-10-CM

## 2024-03-13 NOTE — Progress Notes (Signed)
 E-Visit for Diarrhea  We are sorry that you are not feeling well.  Here is how we plan to help!  Based on what you have shared with me it looks like you have Acute Infectious Diarrhea.  Most cases of acute diarrhea are due to infections with virus and bacteria and are self-limited conditions lasting less than 14 days.  For your symptoms you may take Imodium 2 mg tablets that are over the counter at your local pharmacy. Take two tablet now and then one after each loose stool up to 6 a day.  Antibiotics are not needed for most people with diarrhea.  A work note has been provided for you today. It will be available under "letters" in your MyChart account.   HOME CARE We recommend changing your diet to help with your symptoms for the next few days. Drink plenty of fluids that contain water salt and sugar. Sports drinks such as Gatorade may help.  You may try broths, soups, bananas, applesauce, soft breads, mashed potatoes or crackers.  You are considered infectious for as long as the diarrhea continues. Hand washing or use of alcohol based hand sanitizers is recommend. It is best to stay out of work or school until your symptoms stop.   GET HELP RIGHT AWAY If you have dark yellow colored urine or do not pass urine frequently you should drink more fluids.   If your symptoms worsen  If you feel like you are going to pass out (faint) You have a new problem  MAKE SURE YOU  Understand these instructions. Will watch your condition. Will get help right away if you are not doing well or get worse.  Thank you for choosing an e-visit.  Your e-visit answers were reviewed by a board certified advanced clinical practitioner to complete your personal care plan. Depending upon the condition, your plan could have included both over the counter or prescription medications.  Please review your pharmacy choice. Make sure the pharmacy is open so you can pick up prescription now. If there is a problem,  you may contact your provider through [employer] and have the prescription routed to another pharmacy.  Your safety is important to [place]. If you have drug allergies check your prescription carefully.   For the next 24 hours you can use MyChart to ask questions about today's visit, request a non-urgent call back, or ask for a work or school excuse. You will get an email in the next two days asking about your experience. I hope that your e-visit has been valuable and will speed your recovery.   I have spent 5 minutes in review of e-visit questionnaire, review and updating patient chart, medical decision making and response to patient.   Margaretann Loveless, PA-C

## 2024-03-20 ENCOUNTER — Ambulatory Visit: Admitting: Cardiology

## 2024-04-06 ENCOUNTER — Telehealth: Admitting: Family

## 2024-04-06 DIAGNOSIS — B3731 Acute candidiasis of vulva and vagina: Secondary | ICD-10-CM

## 2024-04-06 MED ORDER — FLUCONAZOLE 150 MG PO TABS: 150.0000 mg | ORAL_TABLET | ORAL | 0 refills | Status: DC | PRN

## 2024-04-06 MED ORDER — MONISTAT 1 COMBO PACK 1200 & 2 MG & % VA KIT: 1.0000 | PACK | Freq: Once | VAGINAL | 0 refills | Status: AC

## 2024-04-06 NOTE — Progress Notes (Signed)

## 2024-04-28 ENCOUNTER — Emergency Department

## 2024-04-28 ENCOUNTER — Other Ambulatory Visit: Payer: Self-pay

## 2024-04-28 DIAGNOSIS — N390 Urinary tract infection, site not specified: Secondary | ICD-10-CM | POA: Insufficient documentation

## 2024-04-28 DIAGNOSIS — R300 Dysuria: Secondary | ICD-10-CM | POA: Diagnosis present

## 2024-04-28 LAB — CBC
HCT: 37.8 % (ref 36.0–46.0)
Hemoglobin: 12.6 g/dL (ref 12.0–15.0)
MCH: 29.5 pg (ref 26.0–34.0)
MCHC: 33.3 g/dL (ref 30.0–36.0)
MCV: 88.5 fL (ref 80.0–100.0)
Platelets: 408 K/uL — ABNORMAL HIGH (ref 150–400)
RBC: 4.27 MIL/uL (ref 3.87–5.11)
RDW: 11.9 % (ref 11.5–15.5)
WBC: 7.2 K/uL (ref 4.0–10.5)
nRBC: 0 % (ref 0.0–0.2)

## 2024-04-28 LAB — URINALYSIS, ROUTINE W REFLEX MICROSCOPIC
Bilirubin Urine: NEGATIVE
Glucose, UA: NEGATIVE mg/dL
Ketones, ur: NEGATIVE mg/dL
Nitrite: NEGATIVE
Protein, ur: NEGATIVE mg/dL
Specific Gravity, Urine: 1.013 (ref 1.005–1.030)
WBC, UA: >50 WBC/hpf (ref 0–5)
pH: 7 (ref 5.0–8.0)

## 2024-04-28 LAB — COMPREHENSIVE METABOLIC PANEL WITH GFR
ALT: 12 U/L (ref 0–44)
AST: 17 U/L (ref 15–41)
Albumin: 4.1 g/dL (ref 3.5–5.0)
Alkaline Phosphatase: 49 U/L (ref 38–126)
Anion gap: 12 (ref 5–15)
BUN: 10 mg/dL (ref 6–20)
CO2: 25 mmol/L (ref 22–32)
Calcium: 9.5 mg/dL (ref 8.9–10.3)
Chloride: 99 mmol/L (ref 98–111)
Creatinine, Ser: 0.66 mg/dL (ref 0.44–1.00)
GFR, Estimated: >60 mL/min (ref >60)
Glucose, Bld: 98 mg/dL (ref 70–99)
Potassium: 3.7 mmol/L (ref 3.5–5.1)
Sodium: 136 mmol/L (ref 135–145)
Total Bilirubin: 0.5 mg/dL (ref 0.0–1.2)
Total Protein: 7.6 g/dL (ref 6.5–8.1)

## 2024-04-28 LAB — LIPASE, BLOOD: Lipase: 28 U/L (ref 11–51)

## 2024-04-28 LAB — POC URINE PREG, ED: Preg Test, Ur: NEGATIVE

## 2024-04-28 NOTE — ED Triage Notes (Signed)
 Pt to ED via POV c/o LLQ abd pain. Says it started a few weeks ago but she ignored it. Radiates to left flank. Pt endorses discomfort with urination and blood in urine. Denies CP, SOB, fevers, dizziness

## 2024-04-29 ENCOUNTER — Emergency Department
Admission: EM | Admit: 2024-04-29 | Discharge: 2024-04-29 | Disposition: A | Discharge status: Home / Self Care | Attending: Emergency Medicine | Admitting: Emergency Medicine

## 2024-04-29 DIAGNOSIS — N390 Urinary tract infection, site not specified: Secondary | ICD-10-CM

## 2024-04-29 MED ORDER — PHENAZOPYRIDINE HCL 100 MG PO TABS
95.0000 mg | ORAL_TABLET | Freq: Once | ORAL | Status: AC
  Administered 2024-04-29: 100 mg via ORAL
  Filled 2024-04-29: qty 1

## 2024-04-29 MED ORDER — IBUPROFEN 800 MG PO TABS
800.0000 mg | ORAL_TABLET | Freq: Once | ORAL | Status: AC
  Administered 2024-04-29: 800 mg via ORAL
  Filled 2024-04-29: qty 1

## 2024-04-29 MED ORDER — IBUPROFEN 800 MG PO TABS: 800.0000 mg | ORAL_TABLET | Freq: Three times a day (TID) | ORAL | 0 refills | Status: AC | PRN

## 2024-04-29 MED ORDER — ONDANSETRON 4 MG PO TBDP
4.0000 mg | ORAL_TABLET | Freq: Once | ORAL | Status: AC
  Administered 2024-04-29: 4 mg via ORAL
  Filled 2024-04-29: qty 1

## 2024-04-29 MED ORDER — CEPHALEXIN 500 MG PO CAPS: 500.0000 mg | ORAL_CAPSULE | Freq: Two times a day (BID) | ORAL | 0 refills | Status: AC

## 2024-04-29 MED ORDER — PHENAZOPYRIDINE HCL 95 MG PO TABS
95.0000 mg | ORAL_TABLET | Freq: Three times a day (TID) | ORAL | 0 refills | Status: AC | PRN
Start: 2024-04-29 — End: ?

## 2024-04-29 MED ORDER — ONDANSETRON 4 MG PO TBDP
4.0000 mg | ORAL_TABLET | Freq: Four times a day (QID) | ORAL | 0 refills | Status: AC | PRN
Start: 2024-04-29 — End: ?

## 2024-04-29 MED ORDER — CEPHALEXIN 500 MG PO CAPS
500.0000 mg | ORAL_CAPSULE | Freq: Once | ORAL | Status: AC
  Administered 2024-04-29: 500 mg via ORAL
  Filled 2024-04-29: qty 1

## 2024-04-29 NOTE — ED Provider Notes (Signed)
 [facility] Provider Note    Event Date/Time   First MD Initiated Contact with Patient 04/29/24 0031     (approximate)   History   Abdominal Pain   HPI  Cheyenne Weeks is a 43 y.o. female with history of anxiety who presents to the emergency department with lower abdominal pain, dysuria, hematuria.  Pain radiates into the flank.  No fevers, vomiting or diarrhea.  She is concerned she has a UTI.  No abnormal vaginal discharge or bleeding.  Just finished treatment for yeast infection after she was taking amoxicillin  for dental pain.   History provided by patient.    Past Medical History:  Diagnosis Date   Anxiety    Scoliosis     Past Surgical History:  Procedure Laterality Date   CESAREAN SECTION      MEDICATIONS:  Prior to Admission medications   Medication Sig Start Date End Date Taking? Authorizing Provider  albuterol  (VENTOLIN  HFA) 108 (90 Base) MCG/ACT inhaler Inhale 1-2 puffs into the lungs every 6 (six) hours as needed. 12/31/23   Vivienne Delon HERO, PA-C  amphetamine-dextroamphetamine (ADDERALL) 20 MG tablet Take 20 mg by mouth 3 (three) times daily. 10/20/21   [provider]  fluconazole  (DIFLUCAN ) 150 MG tablet Take 1 tablet (150 mg total) by mouth every three (3) days as needed. 04/06/24   Lavell Bari LABOR, FNP  fluticasone  (FLONASE ) 50 MCG/ACT nasal spray Place 2 sprays into both nostrils daily. 04/07/23   Nilah Belcourt, Jessica Z, PA-C  LORazepam  (ATIVAN ) 1 MG tablet Take 1 mg by mouth 2 (two) times daily. 10/20/21   [provider]  metroNIDAZOLE  (FLAGYL ) 500 MG tablet Take 1 tab BID for 7 days; NO alcohol use for 10 days after prescription start 02/12/24   Copland, Alicia B, PA-C  triamcinolone  (KENALOG ) 0.025 % ointment Apply 1 Application topically 2 (two) times daily. 01/14/24   Vivienne Delon HERO, PA-C    Physical Exam   Triage Vital Signs: ED Triage Vitals  Encounter Vitals Group     BP 04/28/24 1927 (!) 147/91      Girls Systolic BP Percentile --      Girls Diastolic BP Percentile --      Boys Systolic BP Percentile --      Boys Diastolic BP Percentile --      Pulse Rate 04/28/24 1927 85     Resp 04/28/24 1927 16     Temp 04/28/24 1927 98.2 F (36.8 C)     Temp Source 04/29/24 0038 Oral     SpO2 04/28/24 1927 99 %     Weight 04/28/24 1931 140 lb (63.5 kg)     Height 04/28/24 1931 5' 5 (1.651 m)     Head Circumference --      Peak Flow --      Pain Score 04/28/24 1930 8     Pain Loc --      Pain Education --      Exclude from Growth Chart --     Most recent vital signs: Vitals:   04/29/24 0038 04/29/24 0100  BP: 120/89 109/80  Pulse: (!) 101 95  Resp: 18   Temp: 98.2 F (36.8 C)   SpO2: 97% 97%    CONSTITUTIONAL: Alert, responds appropriately to questions. Well-appearing; well-nourished HEAD: Normocephalic, atraumatic EYES: Conjunctivae clear, pupils appear equal, sclera nonicteric ENT: normal nose; moist mucous membranes NECK: Supple, normal ROM CARD: RRR; S1 and S2 appreciated RESP: Normal chest excursion without splinting or  tachypnea; breath sounds clear and equal bilaterally; no wheezes, no rhonchi, no rales, no hypoxia or respiratory distress, speaking full sentences ABD/GI: Non-distended; soft, non-tender, no rebound, no guarding, no peritoneal signs BACK: The back appears normal EXT: Normal ROM in all joints; no deformity noted, no edema SKIN: Normal color for age and race; warm; no rash on exposed skin NEURO: Moves all extremities equally, normal speech PSYCH: The patient's mood and manner are appropriate.   ED Results / Procedures / Treatments   LABS: (all labs ordered are listed, but only abnormal results are displayed) Labs Reviewed  CBC - Abnormal; Notable for the following components:      Result Value   Platelets 408 (*)    All other components within normal limits  URINALYSIS, ROUTINE W REFLEX MICROSCOPIC - Abnormal; Notable for the following components:    Color, Urine YELLOW (*)    APPearance CLOUDY (*)    Hgb urine dipstick SMALL (*)    Leukocytes,Ua LARGE (*)    Bacteria, UA RARE (*)    All other components within normal limits  URINE CULTURE  LIPASE, BLOOD  COMPREHENSIVE METABOLIC PANEL WITH GFR  POC URINE PREG, ED     EKG:  EKG Interpretation Date/Time:    Ventricular Rate:    PR Interval:    QRS Duration:    QT Interval:    QTC Calculation:   R Axis:      Text Interpretation:           RADIOLOGY: My personal review and interpretation of imaging: CT scan shows no kidney stone, pyelonephritis.  I have personally reviewed all radiology reports.   No results found.    PROCEDURES:  Critical Care performed: No   Procedures    IMPRESSION / MDM / ASSESSMENT AND PLAN / ED COURSE  I reviewed the triage vital signs and the nursing notes.    Patient here with urinary symptoms, flank pain.     DIFFERENTIAL DIAGNOSIS (includes but not limited to):   UTI, pyelonephritis, kidney stone, doubt appendicitis   Patient's presentation is most consistent with acute presentation with potential threat to life or bodily function.   PLAN: Workup initiated from triage.  No leukocytosis, normal creatinine.  Patient's urine does appear infected.  Culture pending.  Pregnancy test negative.  CT scan reviewed and interpreted by myself and the radiologist and shows no hydronephrosis, ureterolithiasis.  Appendix is normal.  Low suspicion clinically for pyelonephritis.  Will discharge with antibiotics and recommended Tylenol, Motrin , Azo as needed.  Patient comfortable with this plan.   MEDICATIONS GIVEN IN ED: Medications  cephALEXin  (KEFLEX ) capsule 500 mg (500 mg Oral Given 04/29/24 0124)  ibuprofen  (ADVIL ) tablet 800 mg (800 mg Oral Given 04/29/24 0124)  phenazopyridine  (PYRIDIUM ) tablet 100 mg (100 mg Oral Given 04/29/24 0124)  ondansetron  (ZOFRAN -ODT) disintegrating tablet 4 mg (4 mg Oral Given 04/29/24 0124)     ED  COURSE: I also called in a prescription of Diflucan  to patient's pharmacy given she states she develops yeast infections with oral antibiotics.   At this time, I do not feel there is any life-threatening condition present. I reviewed all nursing notes, vitals, pertinent previous records.  All lab and urine results, EKGs, imaging ordered have been independently reviewed and interpreted by myself.  I reviewed all available radiology reports from any imaging ordered this visit.  Based on my assessment, I feel the patient is safe to be discharged home without further emergent workup and can continue workup as  an outpatient as needed. Discussed all findings, treatment plan as well as usual and customary return precautions.  They verbalize understanding and are comfortable with this plan.  Outpatient follow-up has been provided as needed.  All questions have been answered.    CONSULTS:  none   OUTSIDE RECORDS REVIEWED: Reviewed recent family medicine notes.       FINAL CLINICAL IMPRESSION(S) / ED DIAGNOSES   Final diagnoses:  Acute UTI     Rx / DC Orders   ED Discharge Orders          Ordered    ibuprofen  (ADVIL ) 800 MG tablet  Every 8 hours PRN        04/29/24 0104    phenazopyridine  (PYRIDIUM ) 95 MG tablet  3 times daily PRN        04/29/24 0104    ondansetron  (ZOFRAN -ODT) 4 MG disintegrating tablet  Every 6 hours PRN        04/29/24 0104    cephALEXin  (KEFLEX ) 500 MG capsule  2 times daily        04/29/24 0104             Note:  This document was prepared using Dragon voice recognition software and may include unintentional dictation errors.   Salvatore Poe, Josette SAILOR, DO 04/30/24 [phone]

## 2024-04-29 NOTE — Discharge Instructions (Addendum)
 You may alternate over the counter Tylenol 1000 mg every 6 hours as needed for pain, fever and Ibuprofen 800 mg every 6-8 hours as needed for pain, fever.  Please take Ibuprofen with food.  Do not take more than 4000 mg of Tylenol (acetaminophen) in a 24 hour period.

## 2024-05-01 LAB — URINE CULTURE: Culture: >=100000 — AB

## 2024-05-05 ENCOUNTER — Telehealth: Admitting: Physician Assistant

## 2024-05-05 DIAGNOSIS — T3695XA Adverse effect of unspecified systemic antibiotic, initial encounter: Secondary | ICD-10-CM | POA: Diagnosis not present

## 2024-05-05 DIAGNOSIS — B379 Candidiasis, unspecified: Secondary | ICD-10-CM

## 2024-05-05 MED ORDER — FLUCONAZOLE 150 MG PO TABS
150.0000 mg | ORAL_TABLET | ORAL | 0 refills | Status: AC | PRN
Start: 2024-05-05 — End: ?

## 2024-05-05 NOTE — Progress Notes (Signed)

## 2024-06-13 ENCOUNTER — Telehealth: Admitting: Family

## 2024-06-13 DIAGNOSIS — K047 Periapical abscess without sinus: Secondary | ICD-10-CM

## 2024-06-13 DIAGNOSIS — K0889 Other specified disorders of teeth and supporting structures: Secondary | ICD-10-CM

## 2024-06-14 MED ORDER — CLINDAMYCIN HCL 300 MG PO CAPS: 300.0000 mg | ORAL_CAPSULE | Freq: Three times a day (TID) | ORAL | 0 refills | Status: AC

## 2024-06-14 MED ORDER — ALBUTEROL SULFATE HFA 108 (90 BASE) MCG/ACT IN AERS: 2.0000 | INHALATION_SPRAY | Freq: Four times a day (QID) | RESPIRATORY_TRACT | 0 refills | Status: DC | PRN

## 2024-06-14 NOTE — Progress Notes (Signed)
Approximately 5 minutes was spent documenting and reviewing patient's chart.

## 2024-06-14 NOTE — Progress Notes (Signed)
 E-Visit for Dental Pain  We are sorry that you are not feeling well.  Here is how we plan to help!  Based on what you have shared with me in the questionnaire, it sounds like you have dental pain.   Clindamycin  300mg  3 times a day for 7 days  It is imperative that you see a dentist within 10 days of this eVisit to determine the cause of the dental pain and be sure it is adequately treated  I have also sent in an albuterol  inhaler for you. If your shortness of breath continues or worsens you need to be seen in person.   A toothache or tooth pain is caused when the nerve in the root of a tooth or surrounding a tooth is irritated. Dental (tooth) infection, decay, injury, or loss of a tooth are the most common causes of dental pain. Pain may also occur after an extraction (tooth is pulled out). Pain sometimes originates from other areas and radiates to the jaw, thus appearing to be tooth pain.Bacteria growing inside your mouth can contribute to gum disease and dental decay, both of which can cause pain. A toothache occurs from inflammation of the central portion of the tooth called pulp. The pulp contains nerve endings that are very sensitive to pain. Inflammation to the pulp or pulpitis may be caused by dental cavities, trauma, and infection.    HOME CARE:   For toothaches: Over-the-counter pain medications such as acetaminophen or ibuprofen  may be used. Take these as directed on the package while you arrange for a dental appointment. Avoid very cold or hot foods, because they may make the pain worse. You may get relief from biting on a cotton ball soaked in oil of cloves. You can get oil of cloves at most drug stores.  For jaw pain:  Aspirin may be helpful for problems in the joint of the jaw in adults. If pain happens every time you open your mouth widely, the temporomandibular joint (TMJ) may be the source of the pain. Yawning or taking a large bite of food may worsen the pain. An appointment  with your doctor or dentist will help you find the cause.     GET HELP RIGHT AWAY IF:  You have a high fever or chills If you have had a recent head or face injury and develop headache, light headedness, nausea, vomiting, or other symptoms that concern you after an injury to your face or mouth, you could have a more serious injury in addition to your dental injury. A facial rash associated with a toothache: This condition may improve with medication. Contact your doctor for them to decide what is appropriate. Any jaw pain occurring with chest pain: Although jaw pain is most commonly caused by dental disease, it is sometimes referred pain from other areas. People with heart disease, especially people who have had stents placed, people with diabetes, or those who have had heart surgery may have jaw pain as a symptom of heart attack or angina. If your jaw or tooth pain is associated with lightheadedness, sweating, or shortness of breath, you should see a doctor as soon as possible. Trouble swallowing or excessive pain or bleeding from gums: If you have a history of a weakened immune system, diabetes, or steroid use, you may be more susceptible to infections. Infections can often be more severe and extensive or caused by unusual organisms. Dental and gum infections in people with these conditions may require more aggressive treatment. An abscess may  need draining or IV antibiotics, for example.  MAKE SURE YOU   Understand these instructions. Will watch your condition. Will get help right away if you are not doing well or get worse.  Thank you for choosing an e-visit.  Your e-visit answers were reviewed by a board certified advanced clinical practitioner to complete your personal care plan. Depending upon the condition, your plan could have included both over the counter or prescription medications.  Please review your pharmacy choice. Make sure the pharmacy is open so you can pick up prescription  now. If there is a problem, you may contact your provider through [employer] and have the prescription routed to another pharmacy.  Your safety is important to us . If you have drug allergies check your prescription carefully.   For the next 24 hours you can use MyChart to ask questions about today's visit, request a non-urgent call back, or ask for a work or school excuse. You will get an email in the next two days asking about your experience. I hope that your e-visit has been valuable and will speed your recovery.

## 2024-08-03 ENCOUNTER — Telehealth: Admitting: Physician Assistant

## 2024-08-03 DIAGNOSIS — J069 Acute upper respiratory infection, unspecified: Secondary | ICD-10-CM

## 2024-08-03 MED ORDER — PSEUDOEPH-BROMPHEN-DM 30-2-10 MG/5ML PO SYRP: 5.0000 mL | ORAL_SOLUTION | Freq: Four times a day (QID) | ORAL | 0 refills | Status: AC | PRN

## 2024-08-03 MED ORDER — ALBUTEROL SULFATE HFA 108 (90 BASE) MCG/ACT IN AERS: 2.0000 | INHALATION_SPRAY | Freq: Four times a day (QID) | RESPIRATORY_TRACT | 0 refills | Status: AC | PRN

## 2024-08-03 NOTE — Progress Notes (Signed)
 We are sorry that you are not feeling well.  Here is how we plan to help!  Based on your presentation I believe you most likely have A cough due to a virus.  This is called viral bronchitis and is best treated by rest, plenty of fluids and control of the cough.  You may use Ibuprofen  or Tylenol as directed to help your symptoms.     In addition you may use Bromphed a prescription strength cough medicine.  I have also prescribed an inhaler.    From your responses in the eVisit questionnaire you describe inflammation in the upper respiratory tract which is causing a significant cough.  This is commonly called Bronchitis and has four common causes:   Allergies Viral Infections Acid Reflux Bacterial Infection Allergies, viruses and acid reflux are treated by controlling symptoms or eliminating the cause. An example might be a cough caused by taking certain blood pressure medications. You stop the cough by changing the medication. Another example might be a cough caused by acid reflux. Controlling the reflux helps control the cough.  USE OF BRONCHODILATOR (RESCUE) INHALERS: There is a risk from using your bronchodilator too frequently.  The risk is that over-reliance on a medication which only relaxes the muscles surrounding the breathing tubes can reduce the effectiveness of medications prescribed to reduce swelling and congestion of the tubes themselves.  Although you feel brief relief from the bronchodilator inhaler, your asthma may actually be worsening with the tubes becoming more swollen and filled with mucus.  This can delay other crucial treatments, such as oral steroid medications. If you need to use a bronchodilator inhaler daily, several times per day, you should discuss this with your provider.  There are probably better treatments that could be used to keep your asthma under control.     HOME CARE Only take medications as instructed by your medical team. Complete the entire course of an  antibiotic. Drink plenty of fluids and get plenty of rest. Avoid close contacts especially the very young and the elderly Cover your mouth if you cough or cough into your sleeve. Always remember to wash your hands A steam or ultrasonic humidifier can help congestion.   GET HELP RIGHT AWAY IF: You develop worsening fever. You become short of breath You cough up blood. Your symptoms persist after you have completed your treatment plan MAKE SURE YOU  Understand these instructions. Will watch your condition. Will get help right away if you are not doing well or get worse.  Your e-visit answers were reviewed by a board certified advanced clinical practitioner to complete your personal care plan.  Depending on the condition, your plan could have included both over the counter or prescription medications. If there is a problem please reply  once you have received a response from your provider. Your safety is important to us .  If you have drug allergies check your prescription carefully.    You can use MyChart to ask questions about today's visit, request a non-urgent call back, or ask for a work or school excuse for 24 hours related to this e-Visit. If it has been greater than 24 hours you will need to follow up with your provider, or enter a new e-Visit to address those concerns. You will get an e-mail in the next two days asking about your experience.  I hope that your e-visit has been valuable and will speed your recovery. Thank you for using e-visits.   I have spent 5 minutes in  review of e-visit questionnaire, review and updating patient chart, medical decision making and response to patient.   Teena Shuck, PA-C

## 2024-08-27 ENCOUNTER — Telehealth: Admitting: Physician Assistant

## 2024-08-27 DIAGNOSIS — B379 Candidiasis, unspecified: Secondary | ICD-10-CM

## 2024-08-27 DIAGNOSIS — B9689 Other specified bacterial agents as the cause of diseases classified elsewhere: Secondary | ICD-10-CM | POA: Diagnosis not present

## 2024-08-27 DIAGNOSIS — N760 Acute vaginitis: Secondary | ICD-10-CM | POA: Diagnosis not present

## 2024-08-27 DIAGNOSIS — B3731 Acute candidiasis of vulva and vagina: Secondary | ICD-10-CM | POA: Diagnosis not present

## 2024-08-27 DIAGNOSIS — T3695XA Adverse effect of unspecified systemic antibiotic, initial encounter: Secondary | ICD-10-CM

## 2024-08-27 MED ORDER — FLUCONAZOLE 150 MG PO TABS: 150.0000 mg | ORAL_TABLET | Freq: Every day | ORAL | 0 refills | Status: AC

## 2024-08-27 MED ORDER — METRONIDAZOLE 500 MG PO TABS: 500.0000 mg | ORAL_TABLET | Freq: Two times a day (BID) | ORAL | 0 refills | Status: AC

## 2024-08-27 NOTE — Progress Notes (Signed)
 We are sorry that you are not feeling well. Here is how we plan to help! Based on what you shared with me it looks like you: May have a vaginosis due to bacteria  Vaginosis is an inflammation of the vagina that can result in discharge, itching and pain. The cause is usually a change in the normal balance of vaginal bacteria or an infection. Vaginosis can also result from reduced estrogen levels after menopause.  The most common causes of vaginosis are:   Bacterial vaginosis which results from an overgrowth of one on several organisms that are normally present in your vagina.   Yeast infections which are caused by a naturally occurring fungus called candida.   Vaginal atrophy (atrophic vaginosis) which results from the thinning of the vagina from reduced estrogen levels after menopause.   Trichomoniasis which is caused by a parasite and is commonly transmitted by sexual intercourse.  Factors that increase your risk of developing vaginosis include: Medications, such as antibiotics and steroids Uncontrolled diabetes Use of hygiene products such as bubble bath, vaginal spray or vaginal deodorant Douching Wearing damp or tight-fitting clothing Using an intrauterine device (IUD) for birth control Hormonal changes, such as those associated with pregnancy, birth control pills or menopause Sexual activity Having a sexually transmitted infection  Your treatment plan is Metronidazole  or Flagyl  500mg  twice a day for 7 days.  I have electronically sent this prescription into the pharmacy that you have chosen. I will also send diflucan .  Be sure to take all of the medication as directed. Stop taking any medication if you develop a rash, tongue swelling or shortness of breath. Mothers who are breast feeding should consider pumping and discarding their breast milk while on these antibiotics. However, there is no consensus that infant exposure at these doses would be harmful.  Remember that medication  creams can weaken latex condoms.   HOME CARE:  Good hygiene may prevent some types of vaginosis from recurring and may relieve some symptoms:  Avoid baths, hot tubs and whirlpool spas. Rinse soap from your outer genital area after a shower, and dry the area well to prevent irritation. Don't use scented or harsh soaps, such as those with deodorant or antibacterial action. Avoid irritants. These include scented tampons and pads. Wipe from front to back after using the toilet. Doing so avoids spreading fecal bacteria to your vagina.  Other things that may help prevent vaginosis include:  Don't douche. Your vagina doesn't require cleansing other than normal bathing. Repetitive douching disrupts the normal organisms that reside in the vagina and can actually increase your risk of vaginal infection. Douching won't clear up a vaginal infection. Use a latex condom. Both female and female latex condoms may help you avoid infections spread by sexual contact. Wear cotton underwear. Also wear pantyhose with a cotton crotch. If you feel comfortable without it, skip wearing underwear to bed. Yeast thrives in [place] Your symptoms should improve in the next day or two.  GET HELP RIGHT AWAY IF:  You have pain in your lower abdomen ( pelvic area or over your ovaries) You develop nausea or vomiting You develop a fever Your discharge changes or worsens You have persistent pain with intercourse You develop shortness of breath, a rapid pulse, or you faint.  These symptoms could be signs of problems or infections that need to be evaluated by a medical provider now.  MAKE SURE YOU   Understand these instructions. Will watch your condition. Will get help right away  if you are not doing well or get worse.  Your e-visit answers were reviewed by a board certified advanced clinical practitioner to complete your personal care plan. Depending upon the condition, your plan could have included both over  the counter or prescription medications. Please review your pharmacy choice to make sure that you have choses a pharmacy that is open for you to pick up any needed prescription, Your safety is important to us . If you have drug allergies check your prescription carefully.   You can use MyChart to ask questions about today's visit, request a non-urgent call back, or ask for a work or school excuse for 24 hours related to this e-Visit. If it has been greater than 24 hours you will need to follow up with your provider, or enter a new e-Visit to address those concerns. You will get a MyChart message within the next two days asking about your experience. I hope that your e-visit has been valuable and will speed your recovery.  I have spent 5 minutes in review of e-visit questionnaire, review and updating patient chart, medical decision making and response to patient.   Sephira Zellman, FNP

## 2024-09-14 ENCOUNTER — Telehealth: Admitting: Physician Assistant

## 2024-09-14 ENCOUNTER — Encounter: Payer: Self-pay | Admitting: Physician Assistant

## 2024-09-14 DIAGNOSIS — H01139 Eczematous dermatitis of unspecified eye, unspecified eyelid: Secondary | ICD-10-CM

## 2024-09-14 MED ORDER — TRIAMCINOLONE ACETONIDE 0.025 % EX OINT: 1.0000 | TOPICAL_OINTMENT | Freq: Two times a day (BID) | CUTANEOUS | 0 refills | Status: AC

## 2024-09-14 NOTE — Progress Notes (Signed)
 E Visit for Rash  We are sorry that you are not feeling well. Here is how we plan to help!   I have refilled your kenalog  for you.  I do encourage you to be seen in person prior to needing another refill.   HOME CARE:  Take cool showers and avoid direct sunlight. Apply cool compress or wet dressings. Take a bath in an oatmeal bath.  Sprinkle content of one Aveeno packet under running faucet with comfortably warm water.  Bathe for 15-20 minutes, 1-2 times daily.  Pat dry with a towel. Do not rub the rash. Use hydrocortisone  cream. Take an antihistamine like Benadryl for widespread rashes that itch.  The adult dose of Benadryl is 25-50 mg by mouth 4 times daily. Caution:  This type of medication may cause sleepiness.  Do not drink alcohol, drive, or operate dangerous machinery while taking antihistamines.  Do not take these medications if you have prostate enlargement.  Read package instructions thoroughly on all medications that you take.  GET HELP RIGHT AWAY IF:  Symptoms don't go away after treatment. Severe itching that persists. If you rash spreads or swells. If you rash begins to smell. If it blisters and opens or develops a yellow-brown crust. You develop a fever. You have a sore throat. You become short of breath.  MAKE SURE YOU:  Understand these instructions. Will watch your condition. Will get help right away if you are not doing well or get worse.  Thank you for choosing an e-visit. Your e-visit answers were reviewed by a board certified advanced clinical practitioner to complete your personal care plan. Depending upon the condition, your plan could have included both over the counter or prescription medications. Please review your pharmacy choice. Be sure that the pharmacy you have chosen is open so that you can pick up your prescription now.  If there is a problem you may message your provider in MyChart to have the prescription routed to another pharmacy. Your safety  is important to us . If you have drug allergies check your prescription carefully.  For the next 24 hours, you can use MyChart to ask questions about today's visit, request a non-urgent call back, or ask for a work or school excuse from your e-visit provider. You will get an email in the next two days asking about your experience. I hope that your e-visit has been valuable and will speed your recovery.  I have spent 5 minutes in review of e-visit questionnaire, review and updating patient chart, medical decision making and response to patient.   Kirk RAMAN Mayers, PA-C
